# Patient Record
Sex: Female | Born: 1945
Health system: Southern US, Community
[De-identification: ages and names within clinical notes are randomized; demographics above are authoritative.]

## PROBLEM LIST (undated history)

## (undated) DIAGNOSIS — N189 Chronic kidney disease, unspecified: Secondary | ICD-10-CM

## (undated) HISTORY — PX: HERNIA REPAIR: SHX51

## (undated) HISTORY — PX: BACK SURGERY: SHX140

## (undated) HISTORY — PX: ABDOMINAL HYSTERECTOMY: SHX81

## (undated) HISTORY — PX: KNEE SURGERY: SHX244

---

## 2009-04-14 DIAGNOSIS — Z8719 Personal history of other diseases of the digestive system: Secondary | ICD-10-CM | POA: Insufficient documentation

## 2009-04-15 DIAGNOSIS — R7989 Other specified abnormal findings of blood chemistry: Secondary | ICD-10-CM | POA: Insufficient documentation

## 2009-04-15 DIAGNOSIS — E7800 Pure hypercholesterolemia, unspecified: Secondary | ICD-10-CM | POA: Insufficient documentation

## 2010-08-02 DIAGNOSIS — M797 Fibromyalgia: Secondary | ICD-10-CM | POA: Insufficient documentation

## 2011-04-11 DIAGNOSIS — M19019 Primary osteoarthritis, unspecified shoulder: Secondary | ICD-10-CM | POA: Insufficient documentation

## 2011-06-25 DIAGNOSIS — M21611 Bunion of right foot: Secondary | ICD-10-CM | POA: Insufficient documentation

## 2011-08-10 DIAGNOSIS — S336XXA Sprain of sacroiliac joint, initial encounter: Secondary | ICD-10-CM | POA: Insufficient documentation

## 2012-02-27 DIAGNOSIS — L219 Seborrheic dermatitis, unspecified: Secondary | ICD-10-CM | POA: Insufficient documentation

## 2012-06-30 DIAGNOSIS — K219 Gastro-esophageal reflux disease without esophagitis: Secondary | ICD-10-CM | POA: Insufficient documentation

## 2012-09-29 DIAGNOSIS — G4700 Insomnia, unspecified: Secondary | ICD-10-CM | POA: Insufficient documentation

## 2012-09-29 DIAGNOSIS — R0982 Postnasal drip: Secondary | ICD-10-CM | POA: Insufficient documentation

## 2012-09-30 DIAGNOSIS — E669 Obesity, unspecified: Secondary | ICD-10-CM | POA: Insufficient documentation

## 2013-07-10 DIAGNOSIS — N182 Chronic kidney disease, stage 2 (mild): Secondary | ICD-10-CM | POA: Insufficient documentation

## 2013-07-10 DIAGNOSIS — E875 Hyperkalemia: Secondary | ICD-10-CM | POA: Insufficient documentation

## 2013-11-26 DIAGNOSIS — M62838 Other muscle spasm: Secondary | ICD-10-CM | POA: Insufficient documentation

## 2014-02-23 DIAGNOSIS — M25532 Pain in left wrist: Secondary | ICD-10-CM | POA: Insufficient documentation

## 2014-06-28 DIAGNOSIS — M549 Dorsalgia, unspecified: Secondary | ICD-10-CM | POA: Insufficient documentation

## 2014-08-17 DIAGNOSIS — K5732 Diverticulitis of large intestine without perforation or abscess without bleeding: Secondary | ICD-10-CM | POA: Insufficient documentation

## 2015-10-10 DIAGNOSIS — K432 Incisional hernia without obstruction or gangrene: Secondary | ICD-10-CM | POA: Insufficient documentation

## 2016-07-04 DIAGNOSIS — R350 Frequency of micturition: Secondary | ICD-10-CM | POA: Insufficient documentation

## 2016-07-10 DIAGNOSIS — K8020 Calculus of gallbladder without cholecystitis without obstruction: Secondary | ICD-10-CM | POA: Insufficient documentation

## 2016-07-10 DIAGNOSIS — R110 Nausea: Secondary | ICD-10-CM | POA: Insufficient documentation

## 2016-11-08 DIAGNOSIS — F418 Other specified anxiety disorders: Secondary | ICD-10-CM | POA: Insufficient documentation

## 2017-04-24 DIAGNOSIS — R5381 Other malaise: Secondary | ICD-10-CM | POA: Insufficient documentation

## 2017-04-24 DIAGNOSIS — R5383 Other fatigue: Secondary | ICD-10-CM | POA: Insufficient documentation

## 2017-04-24 DIAGNOSIS — J101 Influenza due to other identified influenza virus with other respiratory manifestations: Secondary | ICD-10-CM | POA: Insufficient documentation

## 2017-06-12 DIAGNOSIS — R29898 Other symptoms and signs involving the musculoskeletal system: Secondary | ICD-10-CM | POA: Insufficient documentation

## 2017-11-25 DIAGNOSIS — N951 Menopausal and female climacteric states: Secondary | ICD-10-CM | POA: Insufficient documentation

## 2018-02-05 DIAGNOSIS — Z9049 Acquired absence of other specified parts of digestive tract: Secondary | ICD-10-CM | POA: Insufficient documentation

## 2018-03-10 DIAGNOSIS — J301 Allergic rhinitis due to pollen: Secondary | ICD-10-CM | POA: Insufficient documentation

## 2018-03-10 DIAGNOSIS — M25551 Pain in right hip: Secondary | ICD-10-CM | POA: Insufficient documentation

## 2018-03-10 DIAGNOSIS — M7150 Other bursitis, not elsewhere classified, unspecified site: Secondary | ICD-10-CM | POA: Insufficient documentation

## 2018-03-10 DIAGNOSIS — R0989 Other specified symptoms and signs involving the circulatory and respiratory systems: Secondary | ICD-10-CM | POA: Insufficient documentation

## 2018-03-10 DIAGNOSIS — M48061 Spinal stenosis, lumbar region without neurogenic claudication: Secondary | ICD-10-CM | POA: Insufficient documentation

## 2018-03-10 DIAGNOSIS — M1710 Unilateral primary osteoarthritis, unspecified knee: Secondary | ICD-10-CM | POA: Insufficient documentation

## 2018-03-10 DIAGNOSIS — R269 Unspecified abnormalities of gait and mobility: Secondary | ICD-10-CM | POA: Insufficient documentation

## 2018-03-10 DIAGNOSIS — R0609 Other forms of dyspnea: Secondary | ICD-10-CM | POA: Insufficient documentation

## 2018-03-10 DIAGNOSIS — N951 Menopausal and female climacteric states: Secondary | ICD-10-CM | POA: Insufficient documentation

## 2018-03-10 DIAGNOSIS — H3400 Transient retinal artery occlusion, unspecified eye: Secondary | ICD-10-CM | POA: Insufficient documentation

## 2018-03-10 DIAGNOSIS — Z833 Family history of diabetes mellitus: Secondary | ICD-10-CM | POA: Insufficient documentation

## 2018-03-10 DIAGNOSIS — M779 Enthesopathy, unspecified: Secondary | ICD-10-CM | POA: Insufficient documentation

## 2018-05-05 DIAGNOSIS — F341 Dysthymic disorder: Secondary | ICD-10-CM | POA: Insufficient documentation

## 2018-05-05 DIAGNOSIS — G500 Trigeminal neuralgia: Secondary | ICD-10-CM | POA: Insufficient documentation

## 2018-11-17 DIAGNOSIS — R7303 Prediabetes: Secondary | ICD-10-CM | POA: Insufficient documentation

## 2018-11-17 DIAGNOSIS — D72821 Monocytosis (symptomatic): Secondary | ICD-10-CM | POA: Insufficient documentation

## 2019-09-14 DIAGNOSIS — H43811 Vitreous degeneration, right eye: Secondary | ICD-10-CM | POA: Insufficient documentation

## 2019-12-14 DIAGNOSIS — G5602 Carpal tunnel syndrome, left upper limb: Secondary | ICD-10-CM | POA: Insufficient documentation

## 2019-12-14 DIAGNOSIS — N1831 Chronic kidney disease, stage 3a: Secondary | ICD-10-CM | POA: Insufficient documentation

## 2020-01-07 DIAGNOSIS — M25512 Pain in left shoulder: Secondary | ICD-10-CM | POA: Insufficient documentation

## 2020-07-01 DIAGNOSIS — M5432 Sciatica, left side: Secondary | ICD-10-CM | POA: Insufficient documentation

## 2020-07-01 DIAGNOSIS — N644 Mastodynia: Secondary | ICD-10-CM | POA: Insufficient documentation

## 2020-10-05 DIAGNOSIS — R030 Elevated blood-pressure reading, without diagnosis of hypertension: Secondary | ICD-10-CM | POA: Insufficient documentation

## 2021-01-05 DIAGNOSIS — M7061 Trochanteric bursitis, right hip: Secondary | ICD-10-CM | POA: Insufficient documentation

## 2021-01-05 DIAGNOSIS — M7062 Trochanteric bursitis, left hip: Secondary | ICD-10-CM | POA: Insufficient documentation

## 2021-02-06 DIAGNOSIS — N764 Abscess of vulva: Secondary | ICD-10-CM | POA: Insufficient documentation

## 2021-04-03 ENCOUNTER — Encounter: Payer: Self-pay | Admitting: Emergency Medicine

## 2021-04-03 ENCOUNTER — Ambulatory Visit
Admission: EM | Admit: 2021-04-03 | Discharge: 2021-04-03 | Disposition: A | Discharge status: Home / Self Care | Payer: Medicare Other

## 2021-04-03 ENCOUNTER — Other Ambulatory Visit: Payer: Self-pay

## 2021-04-03 ENCOUNTER — Ambulatory Visit: Admission: EM | Admit: 2021-04-03 | Discharge status: Absent without leave | Payer: Self-pay | Source: Home / Self Care

## 2021-04-03 DIAGNOSIS — M5442 Lumbago with sciatica, left side: Secondary | ICD-10-CM

## 2021-04-03 HISTORY — DX: Chronic kidney disease, unspecified: N18.9

## 2021-04-03 NOTE — ED Triage Notes (Signed)
C/o left leg pain worsening over the last week, going from left buttock into left knee/lower leg. States occasional numbness/tingling. Reports hx of sciatic nerve irritation. Denies acute injury to area. Able to walk with mild gait abnormalities/limp

## 2021-04-03 NOTE — ED Provider Notes (Addendum)
[facility]    CSN: [id number] Arrival date & time: 04/03/21  1501      History   Chief Complaint Chief Complaint  Patient presents with   Knee Pain    HPI Angelica James is a 75 y.o. female.   Patient presents with left buttocks pain that radiates throughout entire left leg that has been worsening over the past week.  Has had some occasional numbness but denies tingling.  Patient reports that this has been ongoing for over a month but has worsened over the past week.  Patient was seen on 02/27/2021 and received a spinal injection by the pain management doctor.  Patient reports no improvement with this.  Was then seen on 03/11/2021 for a "lump behind the left knee".  An ultrasound was completed that did not show DVT.  Patient was then seen on 03/21/2021 by family medicine and received prednisone taper as well as ketorolac injection that day.  Patient reports no improvement with these medications.  Patient was then seen on 03/29/2021 by pain management doctor again where no treatment was performed at that time but lumbar decompression or neurosurgical consult were discussed.  Per note, patient wished to have lumbar decompression completed.  This has not been scheduled or completed just yet.  Patient reports that pain is exacerbated by movement.  She reports that pain is severe and is described as a "sharp pain".  Patient does have history of lumbar stenosis as found by MRI multiple years ago per patient.  Denies any urinary or bowel incontinence, saddle anesthesia, urinary burning or frequency.  Patient has taken Tylenol, gabapentin, naproxen for pain as well with no improvement.   Knee Pain  Past Medical History:  Diagnosis Date   Chronic kidney disease     Patient Active Problem List   Diagnosis Date Noted   Abscess of left genital labia 02/06/2021   Trochanteric bursitis of both hips 01/05/2021   Elevated BP without diagnosis of hypertension 10/05/2020   Breast pain,  left 07/01/2020   Sciatica, left side 07/01/2020   Acute pain of left shoulder 01/07/2020   Carpal tunnel syndrome on left 12/14/2019   Stage 3a chronic kidney disease ([place]) 12/14/2019   Posterior vitreous detachment of right eye 09/14/2019   Monocytosis 11/17/2018   Prediabetes 11/17/2018   Persistent depressive disorder with anxious distress, currently mild 05/05/2018   Trigeminal neuralgia of right side of face 05/05/2018   Abnormality of gait 03/10/2018   Enthesopathy 03/10/2018   Family history of diabetes mellitus 03/10/2018   Hip pain, right 03/10/2018   Non-seasonal allergic rhinitis due to pollen 03/10/2018   Other bursitis disorders 03/10/2018   Other dyspnea and respiratory abnormality 03/10/2018   Primary localized osteoarthrosis, lower leg 03/10/2018   Spinal stenosis of lumbar region without neurogenic claudication 03/10/2018   Symptomatic menopausal or female climacteric states 03/10/2018   Transient arterial retinal occlusion 03/10/2018   S/P chole 02/05/2018   Vasomotor symptoms due to menopause 11/25/2017   Left leg weakness 06/12/2017   Influenza B 04/24/2017   Malaise and fatigue 04/24/2017   Other specified anxiety disorders 11/08/2016   Calculus of gallbladder without cholecystitis without obstruction 07/10/2016   Nausea in adult 07/10/2016   Increased frequency of urination 07/04/2016   Incisional hernia, without obstruction or gangrene 10/10/2015   Diverticulitis of sigmoid colon 08/17/2014   Dorsalgia, unspecified 06/28/2014   Wrist pain, left 02/23/2014   Muscle spasms of neck 11/26/2013   Chronic kidney disease, stage II (mild)  07/10/2013   Hyperkalemia 07/10/2013   Obesity (BMI 30-39.9) 09/30/2012   Insomnia 09/29/2012   Post-nasal drip 09/29/2012   Gastro-esophageal reflux disease without esophagitis 06/30/2012   Dermatitis, seborrheic 02/27/2012   Sprain of unspecified site of sacroiliac region 08/10/2011   Bilateral bunions 06/25/2011   DJD  of shoulder 04/11/2011   Fibromyalgia 08/02/2010   Hypercholesteremia 04/15/2009   Positive D-dimer 04/15/2009   History of gastroesophageal reflux (GERD) 04/14/2009    Past Surgical History:  Procedure Laterality Date   ABDOMINAL HYSTERECTOMY     BACK SURGERY     HERNIA REPAIR     KNEE SURGERY      OB History   No obstetric history on file.      Home Medications    Prior to Admission medications   Medication Sig Start Date End Date Taking? Authorizing Provider  acetaminophen (TYLENOL) 650 MG CR tablet Take 1 tablet by mouth daily. 04/18/09  Yes [provider]  albuterol (VENTOLIN HFA) 108 (90 Base) MCG/ACT inhaler Inhale into the lungs. 01/05/21  Yes [provider]  cetirizine (ZYRTEC) 10 MG tablet Take by mouth. 10/05/20  Yes [provider]  cyclobenzaprine (FLEXERIL) 10 MG tablet Take by mouth. 11/08/16  Yes [provider]  Docusate Sodium (DSS) 100 MG CAPS Take by mouth. 11/24/15  Yes [provider]  fluticasone (FLONASE) 50 MCG/ACT nasal spray Place into the nose. 07/10/13  Yes [provider]  gabapentin (NEURONTIN) 100 MG capsule Take by mouth. 07/01/20  Yes [provider]  linaclotide Rolan Lipa) 290 MCG CAPS capsule Take by mouth. 02/13/18  Yes [provider]  omeprazole (PRILOSEC) 40 MG capsule Take by mouth. 12/10/20  Yes [provider]  aspirin 81 MG EC tablet Take by mouth.    [provider]  Cholecalciferol 25 MCG (1000 UT) tablet Take by mouth.    [provider]    Family History History reviewed. No pertinent family history.  Social History Social History   Tobacco Use   Smoking status: Former    Types: Cigarettes   Smokeless tobacco: Never     Allergies   Penicillins and Meloxicam   Review of Systems Review of Systems Per HPI  Physical Exam Triage Vital Signs ED Triage Vitals  Enc Vitals Group     BP 04/03/21 1619 (!) 163/74     Pulse Rate  04/03/21 1619 (!) 103     Resp 04/03/21 1619 16     Temp 04/03/21 1619 98.2 F (36.8 C)     Temp Source 04/03/21 1619 Oral     SpO2 04/03/21 1619 97 %     Weight --      Height --      Head Circumference --      Peak Flow --      Pain Score 04/03/21 1620 10     Pain Loc --      Pain Edu? --      Excl. in [place]? --    No data found.  Updated Vital Signs BP (!) 163/74 (BP Location: Left Arm)   Pulse (!) 103   Temp 98.2 F (36.8 C) (Oral)   Resp 16   SpO2 97%   Visual Acuity Right Eye Distance:   Left Eye Distance:   Bilateral Distance:    Right Eye Near:   Left Eye Near:    Bilateral Near:     Physical Exam Constitutional:      General: She is not in  acute distress.    Appearance: Normal appearance. She is not toxic-appearing or diaphoretic.  HENT:     Head: Normocephalic and atraumatic.  Eyes:     Extraocular Movements: Extraocular movements intact.     Conjunctiva/sclera: Conjunctivae normal.  Pulmonary:     Effort: Pulmonary effort is normal.  Musculoskeletal:     Cervical back: Normal.     Thoracic back: Normal.     Lumbar back: Tenderness present. No swelling, edema or bony tenderness. Normal range of motion. Negative right straight leg raise test and negative left straight leg raise test.       Back:     Comments: Tenderness to palpation to left lower lumbar region.  Unable to perform straight leg raise as patient is not able to tolerate laying flat.  Neurovascularly intact throughout left lower leg.  No tenderness to palpation throughout left lower leg.  Neurological:     General: No focal deficit present.     Mental Status: She is alert and oriented to person, place, and time. Mental status is at baseline.  Psychiatric:        Mood and Affect: Mood normal.        Behavior: Behavior normal.        Thought Content: Thought content normal.        Judgment: Judgment normal.     UC Treatments / Results  Labs (all labs ordered are listed, but only  abnormal results are displayed) Labs Reviewed - No data to display  EKG   Radiology No results found.  Procedures Procedures (including critical care time)  Medications Ordered in UC Medications - No data to display  Initial Impression / Assessment and Plan / UC Course  I have reviewed the triage vital signs and the nursing notes.  Pertinent labs & imaging results that were available during my care of the patient were reviewed by me and considered in my medical decision making (see chart for details).     There are limited options for pain management at this urgent care today.  Unable to prescribe additional steroid treatment as patient has had steroid injection as well as prednisone steroid taper over the past month. Unable to prescribe additional NSAIDs due to recent ketorolac injection as well as naproxen treatment due to not causing any additional kidney damage as patient has CKD stage II.  Also unable to prescribe controlled substances given patient's age and CKD.  Will defer pain management to orthopedist.  Advised patient that she will need to see Orthopedic or spine specialist for further evaluation and management given that pain is refractory to current methods.Patient was provided with contact information for orthopedic urgent cares as well as spine specialist.  Patient was advised to follow-up with these as soon as possible.Discussed strict return precautions. Patient verbalized understanding and is agreeable with plan.  No red flags seen on exam that would warrant immediate evaluation at the hospital at this time. Final Clinical Impressions(s) / UC Diagnoses   Final diagnoses:  Acute left-sided low back pain with left-sided sciatica     Discharge Instructions      Please follow-up with provided contact information for orthopedist as soon as possible.     ED Prescriptions   None    PDMP not reviewed this encounter.   Odis Luster, FNP 04/03/21 [place], [place], [place] 04/03/21 1704

## 2021-04-03 NOTE — Discharge Instructions (Addendum)
Please follow-up with provided contact information for orthopedist as soon as possible.

## 2021-07-21 ENCOUNTER — Telehealth: Payer: Self-pay | Admitting: Hematology

## 2021-07-21 NOTE — Telephone Encounter (Signed)
Scheduled appt per 2/2 referral. Spoke to pt who is aware of appt date and time. Pt is aware to arrive 15 mins prior to appt time. Per pt request updated calendar was sent to pt.

## 2021-08-10 ENCOUNTER — Other Ambulatory Visit: Payer: Self-pay

## 2021-08-10 ENCOUNTER — Inpatient Hospital Stay: Payer: Medicare Other

## 2021-08-10 ENCOUNTER — Inpatient Hospital Stay: Payer: Medicare Other | Attending: Hematology | Admitting: Hematology

## 2021-08-10 VITALS — BP 122/58 | HR 111 | Temp 97.3°F | Resp 18 | Wt 200.6 lb

## 2021-08-10 DIAGNOSIS — Z818 Family history of other mental and behavioral disorders: Secondary | ICD-10-CM | POA: Diagnosis not present

## 2021-08-10 DIAGNOSIS — M549 Dorsalgia, unspecified: Secondary | ICD-10-CM

## 2021-08-10 DIAGNOSIS — M47819 Spondylosis without myelopathy or radiculopathy, site unspecified: Secondary | ICD-10-CM | POA: Diagnosis not present

## 2021-08-10 DIAGNOSIS — Z811 Family history of alcohol abuse and dependence: Secondary | ICD-10-CM | POA: Diagnosis not present

## 2021-08-10 DIAGNOSIS — M48061 Spinal stenosis, lumbar region without neurogenic claudication: Secondary | ICD-10-CM | POA: Diagnosis not present

## 2021-08-10 DIAGNOSIS — Z809 Family history of malignant neoplasm, unspecified: Secondary | ICD-10-CM

## 2021-08-10 DIAGNOSIS — Z880 Allergy status to penicillin: Secondary | ICD-10-CM

## 2021-08-10 DIAGNOSIS — Z79899 Other long term (current) drug therapy: Secondary | ICD-10-CM | POA: Diagnosis not present

## 2021-08-10 DIAGNOSIS — Z87891 Personal history of nicotine dependence: Secondary | ICD-10-CM | POA: Diagnosis not present

## 2021-08-10 DIAGNOSIS — Z803 Family history of malignant neoplasm of breast: Secondary | ICD-10-CM

## 2021-08-10 DIAGNOSIS — Z888 Allergy status to other drugs, medicaments and biological substances status: Secondary | ICD-10-CM

## 2021-08-10 DIAGNOSIS — R898 Other abnormal findings in specimens from other organs, systems and tissues: Secondary | ICD-10-CM | POA: Diagnosis not present

## 2021-08-10 DIAGNOSIS — Z800 Family history of malignant neoplasm of digestive organs: Secondary | ICD-10-CM | POA: Diagnosis not present

## 2021-08-10 DIAGNOSIS — D472 Monoclonal gammopathy: Secondary | ICD-10-CM

## 2021-08-10 LAB — CMP (CANCER CENTER ONLY)
ALT: 16 U/L (ref 0–44)
AST: 17 U/L (ref 15–41)
Albumin: 4.9 g/dL (ref 3.5–5.0)
Alkaline Phosphatase: 66 U/L (ref 38–126)
Anion gap: 7 (ref 5–15)
BUN: 18 mg/dL (ref 8–23)
CO2: 32 mmol/L (ref 22–32)
Calcium: 10.2 mg/dL (ref 8.9–10.3)
Chloride: 102 mmol/L (ref 98–111)
Creatinine: 0.99 mg/dL (ref 0.44–1.00)
GFR, Estimated: 59 mL/min — ABNORMAL LOW (ref >60)
Glucose, Bld: 85 mg/dL (ref 70–99)
Potassium: 3.3 mmol/L — ABNORMAL LOW (ref 3.5–5.1)
Sodium: 141 mmol/L (ref 135–145)
Total Bilirubin: 0.9 mg/dL (ref 0.3–1.2)
Total Protein: 8.3 g/dL — ABNORMAL HIGH (ref 6.5–8.1)

## 2021-08-10 LAB — CBC WITH DIFFERENTIAL/PLATELET
Abs Immature Granulocytes: 0.26 10*3/uL — ABNORMAL HIGH (ref 0.00–0.07)
Basophils Absolute: 0.1 10*3/uL (ref 0.0–0.1)
Basophils Relative: 1 %
Eosinophils Absolute: 0.2 10*3/uL (ref 0.0–0.5)
Eosinophils Relative: 1 %
HCT: 42.9 % (ref 36.0–46.0)
Hemoglobin: 14.1 g/dL (ref 12.0–15.0)
Immature Granulocytes: 1 %
Lymphocytes Relative: 14 %
Lymphs Abs: 2.5 10*3/uL (ref 0.7–4.0)
MCH: 29.7 pg (ref 26.0–34.0)
MCHC: 32.9 g/dL (ref 30.0–36.0)
MCV: 90.5 fL (ref 80.0–100.0)
Monocytes Absolute: 2.3 10*3/uL — ABNORMAL HIGH (ref 0.1–1.0)
Monocytes Relative: 13 %
Neutro Abs: 13.2 10*3/uL — ABNORMAL HIGH (ref 1.7–7.7)
Neutrophils Relative %: 70 %
Platelets: 420 10*3/uL — ABNORMAL HIGH (ref 150–400)
RBC: 4.74 MIL/uL (ref 3.87–5.11)
RDW: 13.4 % (ref 11.5–15.5)
WBC: 18.6 10*3/uL — ABNORMAL HIGH (ref 4.0–10.5)
nRBC: 0 % (ref 0.0–0.2)

## 2021-08-10 LAB — LACTATE DEHYDROGENASE: LDH: 318 U/L — ABNORMAL HIGH (ref 98–192)
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
## 2021-08-11 LAB — SURGICAL PATHOLOGY

## 2021-08-11 LAB — BETA 2 MICROGLOBULIN, SERUM: Beta-2 Microglobulin: 2.1 mg/L (ref 0.6–2.4)

## 2021-08-11 LAB — KAPPA/LAMBDA LIGHT CHAINS
Kappa free light chain: 24.9 mg/L — ABNORMAL HIGH (ref 3.3–19.4)
Kappa, lambda light chain ratio: 1.69 — ABNORMAL HIGH (ref 0.26–1.65)
Lambda free light chains: 14.7 mg/L (ref 5.7–26.3)

## 2021-08-14 ENCOUNTER — Other Ambulatory Visit: Payer: Self-pay

## 2021-08-14 ENCOUNTER — Ambulatory Visit (HOSPITAL_BASED_OUTPATIENT_CLINIC_OR_DEPARTMENT_OTHER): Payer: Medicare Other | Attending: Sports Medicine | Admitting: Physical Therapy

## 2021-08-14 DIAGNOSIS — M47816 Spondylosis without myelopathy or radiculopathy, lumbar region: Secondary | ICD-10-CM | POA: Insufficient documentation

## 2021-08-14 DIAGNOSIS — M5416 Radiculopathy, lumbar region: Secondary | ICD-10-CM | POA: Insufficient documentation

## 2021-08-14 DIAGNOSIS — G8929 Other chronic pain: Secondary | ICD-10-CM | POA: Diagnosis not present

## 2021-08-14 DIAGNOSIS — M62830 Muscle spasm of back: Secondary | ICD-10-CM | POA: Diagnosis not present

## 2021-08-14 DIAGNOSIS — M545 Low back pain, unspecified: Secondary | ICD-10-CM | POA: Diagnosis not present

## 2021-08-14 DIAGNOSIS — M25552 Pain in left hip: Secondary | ICD-10-CM

## 2021-08-14 DIAGNOSIS — R2689 Other abnormalities of gait and mobility: Secondary | ICD-10-CM | POA: Insufficient documentation

## 2021-08-14 DIAGNOSIS — M5136 Other intervertebral disc degeneration, lumbar region: Secondary | ICD-10-CM | POA: Diagnosis not present

## 2021-08-14 LAB — MULTIPLE MYELOMA PANEL, SERUM
Albumin SerPl Elph-Mcnc: 4.4 g/dL (ref 2.9–4.4)
Albumin/Glob SerPl: 1.3 (ref 0.7–1.7)
Alpha 1: 0.2 g/dL (ref 0.0–0.4)
Alpha2 Glob SerPl Elph-Mcnc: 0.7 g/dL (ref 0.4–1.0)
B-Globulin SerPl Elph-Mcnc: 1.1 g/dL (ref 0.7–1.3)
Gamma Glob SerPl Elph-Mcnc: 1.6 g/dL (ref 0.4–1.8)
Globulin, Total: 3.6 g/dL (ref 2.2–3.9)
IgA: 251 mg/dL (ref 64–422)
IgG (Immunoglobin G), Serum: 1562 mg/dL (ref 586–1602)
IgM (Immunoglobulin M), Srm: 207 mg/dL (ref 26–217)
Total Protein ELP: 8 g/dL (ref 6.0–8.5)

## 2021-08-14 LAB — FLOW CYTOMETRY

## 2021-08-14 NOTE — Therapy (Signed)
OUTPATIENT PHYSICAL THERAPY THORACOLUMBAR EVALUATION   Patient Name: Angelica James MRN: [id number] DOB:Oct 26, 1945, 76 y.o., female Today's Date: 08/14/2021   PT End of Session - 08/14/21 1209     Visit Number 1    Number of Visits 12    Date for PT Re-Evaluation 09/25/21    PT Start Time 1147    PT Stop Time 0100    PT Time Calculation (min) 793 min    Activity Tolerance Patient tolerated treatment well    Behavior During Therapy [facility] for tasks assessed/performed             Past Medical History:  Diagnosis Date   Chronic kidney disease    Past Surgical History:  Procedure Laterality Date   ABDOMINAL HYSTERECTOMY     BACK SURGERY     HERNIA REPAIR     KNEE SURGERY     Patient Active Problem List   Diagnosis Date Noted   Abscess of left genital labia 02/06/2021   Trochanteric bursitis of both hips 01/05/2021   Elevated BP without diagnosis of hypertension 10/05/2020   Breast pain, left 07/01/2020   Sciatica, left side 07/01/2020   Acute pain of left shoulder 01/07/2020   Carpal tunnel syndrome on left 12/14/2019   Stage 3a chronic kidney disease ([place]) 12/14/2019   Posterior vitreous detachment of right eye 09/14/2019   Monocytosis 11/17/2018   Prediabetes 11/17/2018   Persistent depressive disorder with anxious distress, currently mild 05/05/2018   Trigeminal neuralgia of right side of face 05/05/2018   Abnormality of gait 03/10/2018   Enthesopathy 03/10/2018   Family history of diabetes mellitus 03/10/2018   Hip pain, right 03/10/2018   Non-seasonal allergic rhinitis due to pollen 03/10/2018   Other bursitis disorders 03/10/2018   Other dyspnea and respiratory abnormality 03/10/2018   Primary localized osteoarthrosis, lower leg 03/10/2018   Spinal stenosis of lumbar region without neurogenic claudication 03/10/2018   Symptomatic menopausal or female climacteric states 03/10/2018   Transient arterial retinal occlusion 03/10/2018   S/P chole 02/05/2018    Vasomotor symptoms due to menopause 11/25/2017   Left leg weakness 06/12/2017   Influenza B 04/24/2017   Malaise and fatigue 04/24/2017   Other specified anxiety disorders 11/08/2016   Calculus of gallbladder without cholecystitis without obstruction 07/10/2016   Nausea in adult 07/10/2016   Increased frequency of urination 07/04/2016   Incisional hernia, without obstruction or gangrene 10/10/2015   Diverticulitis of sigmoid colon 08/17/2014   Dorsalgia, unspecified 06/28/2014   Wrist pain, left 02/23/2014   Muscle spasms of neck 11/26/2013   Chronic kidney disease, stage II (mild) 07/10/2013   Hyperkalemia 07/10/2013   Obesity (BMI 30-39.9) 09/30/2012   Insomnia 09/29/2012   Post-nasal drip 09/29/2012   Gastro-esophageal reflux disease without esophagitis 06/30/2012   Dermatitis, seborrheic 02/27/2012   Sprain of unspecified site of sacroiliac region 08/10/2011   Bilateral bunions 06/25/2011   DJD of shoulder 04/11/2011   Fibromyalgia 08/02/2010   Hypercholesteremia 04/15/2009   Positive D-dimer 04/15/2009   History of gastroesophageal reflux (GERD) 04/14/2009    PCP: Pcp, No  REFERRING PROVIDER: Verner Chol, MD  REFERRING DIAG: Low Back Pain/ Non-invasive decompression of the spine in October of 2022.   THERAPY DIAG: Low Back Pain Left Hip Pain    ONSET DATE: Became Severe in December   SUBJECTIVE:  SUBJECTIVE STATEMENT: The patient reports she began having pain in December of this year in her left hip. It is unclear if she had pain before this. She reports she had some type of decompression in October. Again the timeline of the onset is not very clear. She had been diagnosed with multiple myloma of the back. She reports she is not from this area. She was visiting and got sick.    PERTINENT HISTORY:  Chronic kidney disease  Anxiety ;  Left leg weakness, Trigeminal Neuralgia; Left shoulder DDD; cn have pain on the right as well; fibromylagia  PAIN:  Are you having pain? Yes NPRS scale: 7/10 right now over the past week 9/10 at worst  Pain location: Across the lower back and into the hip. Can go into her anterior hip as well at times  Pain orientation: Left  PAIN TYPE: aching Pain description: constant  Aggravating factors: Patient has increased pain when she is standing for too long  Relieving factors: sitting down   PRECAUTIONS: None  WEIGHT BEARING RESTRICTIONS No  FALLS:  Has patient fallen in last 6 months? No, Number of falls:   LIVING ENVIRONMENT: The patient has an upstairs to go into to get into and out of pain   OCCUPATION: retired   PLOF: Independent  Recreation: walking   PATIENT GOALS   Help the pain/ Keep from having surgery     OBJECTIVE:   DIAGNOSTIC FINDINGS:  Lumbar MRI: L5-S1 moderate to severe left lateral reccess stenois with S1 nerve impingement  Diffusely abnormal arrow signal throughout the visible spine and pelvis  PATIENT SURVEYS:  FOTO not given 2nd to cognition   SCREENING FOR RED FLAGS: Bowel or bladder incontinence: No Spinal tumors: No Cauda equina syndrome: No Compression fracture: No Abdominal aneurysm: No  COGNITION:  Overall cognitive status: Difficulty to assess Could not recall where she lives but also reports she isnt from this area. Had to ask questions a few times. Speaks very quietly.      SENSATION:  Light touch: Appears intact  Stereognosis: Appears intact  Hot/Cold: Appears intact  Proprioception: Appears intact Denies paraesthesias  MUSCLE LENGTH:  POSTURE:    PALPATION: Significant spasming and tenderness to palpation in the left hip and lower back  LUMBARAROM/PROM  A/PROM A/PROM  08/14/2021  Flexion 70 with minor pain   Extension Increased pain just past neutral   Right  lateral flexion Painful but difficult to tell if one way was more painful then the other  Left lateral flexion Painful but difficult to tell if one way was more painful then the other   Right rotation   Left rotation    (Blank rows = not tested)  LE AROM/PROM:  PROM Right 08/14/2021 Left 08/14/2021  Hip flexion 80 degrees before she felt pain  90 with significant pain. Therapy asked patient to make sure to let the therapist know when she was in pain.   Hip extension    Hip abduction    Hip adduction    Hip internal rotation    Hip external rotation    Knee flexion    Knee extension    Ankle dorsiflexion    Ankle plantarflexion    Ankle inversion    Ankle eversion     (Blank rows = not tested)  LE MMT:  MMT Right 08/14/2021 Left 08/14/2021  Hip flexion 3/5 3/5  Hip extension    Hip abduction 4/5 3+/5  Hip adduction    Hip internal rotation  Hip external rotation    Knee flexion    Knee extension 4+/5 4+/5   Ankle dorsiflexion    Ankle plantarflexion    Ankle inversion    Ankle eversion     GAIT: Very slow steps, lateral movement away from the left side; decreased single leg stance on the left   TODAY'S TREATMENT     PATIENT EDUCATION:  Education details: HEP; symptom management  Person educated: Patient Education method: Explanation, Demonstration, Tactile cues, Verbal cues, and Handouts Education comprehension: needs further education   HOME EXERCISE PROGRAM:   ASSESSMENT:  CLINICAL IMPRESSION: Patient is a 76 year old female with left sided lower back pain and left hip pain. She was a bit unclear with her timeline of onset. She also has multiple myleoma. She has significant pain with lumbar extension and hip flexion bilateral. She has significant spasming in her left lower back and into her left hip. She would benefit most from aquatic therapy at this time. The family lives a longer distance from the clinic. She is unsure if she will be able to get out  here for aquatics. If she can not we will likely transfer her to [address] for treatment. She lives very close to [employer]. Again, the pool would be the most beneficial. She was not given an HEP today. Her motion and strength are significantly limited. We will have to take our time to find exercises that she can tolerate at this time. The patients daughter will call back to discuss her mothers POC.      OBJECTIVE IMPAIRMENTS Abnormal gait, decreased activity tolerance, decreased endurance, decreased mobility, difficulty walking, decreased ROM, decreased strength, increased fascial restrictions, increased muscle spasms, and pain.   ACTIVITY LIMITATIONS cleaning, meal prep, and shopping.   PERSONAL FACTORS Chronic kidney disease ;Anxiety ;  Left leg weakness, Trigeminal Neuralgia; Left shoulder DDD; cn have pain on the right as well; fibromylagia  are also affecting patient's functional outcome.    REHAB POTENTIAL: Good  CLINICAL DECISION MAKING: Evolving/moderate complexity progressive decrease in function 2nd to pain   EVALUATION COMPLEXITY: Moderate   GOALS: Goals reviewed with patient? Yes  SHORT TERM GOALS:  STG Name Target Date Goal status  1 Patient will increase passive bilateral hip flexion to 90 degrees  Baseline:  09/04/2021 INITIAL  2 Patient will increase gross bilateral LE strength by 1 grade  Baseline:  09/04/2021 INITIAL  3 Patient will be independent with a basic HEP  Baseline: 09/04/2021 INITIAL  LONG TERM GOALS:   LTG Name Target Date Goal status  1 Patient will stand for 15 minutes without pain  Baseline: 09/25/2021 INITIAL  2 Patient will walk 3000' without pain in order to walk without difficulty  Baseline: 09/25/2021 INITIAL  3 Patient will demonstrate full lumbar ROM bilateral in order to perform ADL's  Baseline: 09/25/2021 INITIAL  PLAN: PT FREQUENCY: 2x/week  PT DURATION: 6 weeks  PLANNED INTERVENTIONS: Therapeutic exercises, Therapeutic activity,  Neuro Muscular re-education, Gait training, Patient/Family education, Joint mobilization, Dry Needling, Electrical stimulation, Cryotherapy, Moist heat, Ultrasound, and Manual therapy  PLAN FOR NEXT SESSION:  If she does the pool consider the float position and manual therapy to the back. If patient is not in the pool consider grade 1 or II LAD to improve left hip flexion; trigger point release to the left hip; gluteal strengthening as tolerated. Wokr on more normal gait in the pool. Per MRI patient may have S1 nerve root compression so follow symptoms when advancing  or scaling back her exercises. We will contact the patient daughter about transferring or starting in the pool.    Carney Living, PT 08/14/2021, 1:40 PM

## 2021-08-25 ENCOUNTER — Inpatient Hospital Stay: Payer: Medicare Other | Attending: Hematology | Admitting: Hematology

## 2021-08-25 DIAGNOSIS — D72821 Monocytosis (symptomatic): Secondary | ICD-10-CM | POA: Diagnosis not present

## 2021-08-25 DIAGNOSIS — Z87891 Personal history of nicotine dependence: Secondary | ICD-10-CM | POA: Insufficient documentation

## 2021-08-25 DIAGNOSIS — R7402 Elevation of levels of lactic acid dehydrogenase (LDH): Secondary | ICD-10-CM | POA: Diagnosis not present

## 2021-08-25 DIAGNOSIS — Z818 Family history of other mental and behavioral disorders: Secondary | ICD-10-CM | POA: Diagnosis not present

## 2021-08-25 DIAGNOSIS — Z79899 Other long term (current) drug therapy: Secondary | ICD-10-CM | POA: Diagnosis not present

## 2021-08-25 DIAGNOSIS — Z880 Allergy status to penicillin: Secondary | ICD-10-CM | POA: Insufficient documentation

## 2021-08-25 DIAGNOSIS — M48061 Spinal stenosis, lumbar region without neurogenic claudication: Secondary | ICD-10-CM | POA: Diagnosis not present

## 2021-08-25 DIAGNOSIS — M47819 Spondylosis without myelopathy or radiculopathy, site unspecified: Secondary | ICD-10-CM | POA: Diagnosis not present

## 2021-08-25 DIAGNOSIS — R898 Other abnormal findings in specimens from other organs, systems and tissues: Secondary | ICD-10-CM | POA: Diagnosis not present

## 2021-08-25 DIAGNOSIS — Z800 Family history of malignant neoplasm of digestive organs: Secondary | ICD-10-CM | POA: Insufficient documentation

## 2021-08-25 DIAGNOSIS — N1831 Chronic kidney disease, stage 3a: Secondary | ICD-10-CM | POA: Insufficient documentation

## 2021-08-25 DIAGNOSIS — D75839 Thrombocytosis, unspecified: Secondary | ICD-10-CM | POA: Insufficient documentation

## 2021-08-25 DIAGNOSIS — Z888 Allergy status to other drugs, medicaments and biological substances status: Secondary | ICD-10-CM | POA: Diagnosis not present

## 2021-08-25 DIAGNOSIS — M549 Dorsalgia, unspecified: Secondary | ICD-10-CM | POA: Diagnosis not present

## 2021-08-25 DIAGNOSIS — D472 Monoclonal gammopathy: Secondary | ICD-10-CM | POA: Insufficient documentation

## 2021-08-30 ENCOUNTER — Ambulatory Visit (HOSPITAL_BASED_OUTPATIENT_CLINIC_OR_DEPARTMENT_OTHER): Payer: Medicare Other | Attending: Sports Medicine | Admitting: Physical Therapy

## 2021-08-30 ENCOUNTER — Encounter (HOSPITAL_BASED_OUTPATIENT_CLINIC_OR_DEPARTMENT_OTHER): Payer: Self-pay | Admitting: Physical Therapy

## 2021-08-30 ENCOUNTER — Other Ambulatory Visit: Payer: Self-pay

## 2021-08-30 DIAGNOSIS — M545 Low back pain, unspecified: Secondary | ICD-10-CM | POA: Diagnosis present

## 2021-08-30 DIAGNOSIS — R2689 Other abnormalities of gait and mobility: Secondary | ICD-10-CM | POA: Insufficient documentation

## 2021-08-30 DIAGNOSIS — G8929 Other chronic pain: Secondary | ICD-10-CM | POA: Diagnosis present

## 2021-08-30 DIAGNOSIS — M62830 Muscle spasm of back: Secondary | ICD-10-CM | POA: Insufficient documentation

## 2021-08-30 DIAGNOSIS — M25552 Pain in left hip: Secondary | ICD-10-CM | POA: Diagnosis present

## 2021-08-30 NOTE — Therapy (Signed)
?OUTPATIENT PHYSICAL THERAPY TREATMENT NOTE ? ? ?Patient Name: Angelica James ?MRN: [id number] ?DOB:05/03/1946, 76 y.o., female ?Today's Date: 08/30/2021 ? ?PCP: Pcp, No ?REFERRING PROVIDER: Verner Chol, MD ? ? PT End of Session - 08/30/21 0932   ? ? Visit Number 2   ? Number of Visits 12   ? Date for PT Re-Evaluation 09/25/21   ? PT Start Time 0815   ? PT Stop Time 0900   ? PT Time Calculation (min) 45 min   ? ?  ?  ? ?  ? ? ?Past Medical History:  ?Diagnosis Date  ? Chronic kidney disease   ? ?Past Surgical History:  ?Procedure Laterality Date  ? ABDOMINAL HYSTERECTOMY    ? BACK SURGERY    ? HERNIA REPAIR    ? KNEE SURGERY    ? ?Patient Active Problem List  ? Diagnosis Date Noted  ? Abscess of left genital labia 02/06/2021  ? Trochanteric bursitis of both hips 01/05/2021  ? Elevated BP without diagnosis of hypertension 10/05/2020  ? Breast pain, left 07/01/2020  ? Sciatica, left side 07/01/2020  ? Acute pain of left shoulder 01/07/2020  ? Carpal tunnel syndrome on left 12/14/2019  ? Stage 3a chronic kidney disease ([place]) 12/14/2019  ? Posterior vitreous detachment of right eye 09/14/2019  ? Monocytosis 11/17/2018  ? Prediabetes 11/17/2018  ? Persistent depressive disorder with anxious distress, currently mild 05/05/2018  ? Trigeminal neuralgia of right side of face 05/05/2018  ? Abnormality of gait 03/10/2018  ? Enthesopathy 03/10/2018  ? Family history of diabetes mellitus 03/10/2018  ? Hip pain, right 03/10/2018  ? Non-seasonal allergic rhinitis due to pollen 03/10/2018  ? Other bursitis disorders 03/10/2018  ? Other dyspnea and respiratory abnormality 03/10/2018  ? Primary localized osteoarthrosis, lower leg 03/10/2018  ? Spinal stenosis of lumbar region without neurogenic claudication 03/10/2018  ? Symptomatic menopausal or female climacteric states 03/10/2018  ? Transient arterial retinal occlusion 03/10/2018  ? S/P chole 02/05/2018  ? Vasomotor symptoms due to menopause 11/25/2017  ? Left leg weakness  06/12/2017  ? Influenza B 04/24/2017  ? Malaise and fatigue 04/24/2017  ? Other specified anxiety disorders 11/08/2016  ? Calculus of gallbladder without cholecystitis without obstruction 07/10/2016  ? Nausea in adult 07/10/2016  ? Increased frequency of urination 07/04/2016  ? Incisional hernia, without obstruction or gangrene 10/10/2015  ? Diverticulitis of sigmoid colon 08/17/2014  ? Dorsalgia, unspecified 06/28/2014  ? Wrist pain, left 02/23/2014  ? Muscle spasms of neck 11/26/2013  ? Chronic kidney disease, stage II (mild) 07/10/2013  ? Hyperkalemia 07/10/2013  ? Obesity (BMI 30-39.9) 09/30/2012  ? Insomnia 09/29/2012  ? Post-nasal drip 09/29/2012  ? Gastro-esophageal reflux disease without esophagitis 06/30/2012  ? Dermatitis, seborrheic 02/27/2012  ? Sprain of unspecified site of sacroiliac region 08/10/2011  ? Bilateral bunions 06/25/2011  ? DJD of shoulder 04/11/2011  ? Fibromyalgia 08/02/2010  ? Hypercholesteremia 04/15/2009  ? Positive D-dimer 04/15/2009  ? History of gastroesophageal reflux (GERD) 04/14/2009  ? ? ?REFERRING DIAG: Low Back Pain/ Non-invasive decompression of the spine in October of 2022. ? ?THERAPY DIAG:  ?Pain in left hip ? ?Chronic bilateral low back pain without sciatica ? ?Muscle spasm of back ? ?Other abnormalities of gait and mobility ? ?PERTINENT HISTORY: Chronic kidney disease  ?Anxiety ;  Left leg weakness, Trigeminal Neuralgia; Left shoulder DDD; cn have pain on the right as well; fibromylagia  ? ?PRECAUTIONS: none ? ?SUBJECTIVE: Pt reports little pain today. ? ?PAIN:  ?Are you  having pain? Yes ?NPRS scale: 2/10 right now over the past week 9/10 at worst  ?Pain location: Across the lower back and into the hip. Can go into her anterior hip as well at times  ?Pain orientation: Left  ?PAIN TYPE: aching ?Pain description: constant  ?Aggravating factors: Patient has increased pain when she is standing for too long  ?Relieving factors: sitting down  ?  ?PRECAUTIONS: None ?  ?WEIGHT  BEARING RESTRICTIONS No ?  ?FALLS:  ?Has patient fallen in last 6 months? No, Number of falls:  ?  ?LIVING ENVIRONMENT: ?The patient has an upstairs to go into to get into and out of pain  ?  ?OCCUPATION: retired  ?  ?PLOF: Independent ?  ?Recreation: walking  ?  ?PATIENT GOALS  ?  ?Help the pain/ Keep from having surgery  ?  ?  ?  ?OBJECTIVE:  ?  ?DIAGNOSTIC FINDINGS:  ?Lumbar MRI: L5-S1 moderate to severe left lateral reccess stenois with S1 nerve impingement ?  ?Diffusely abnormal arrow signal throughout the visible spine and pelvis ?  ?PATIENT SURVEYS:  ?FOTO not given 2nd to cognition  ?  ?SCREENING FOR RED FLAGS: ?Bowel or bladder incontinence: No ?Spinal tumors: No ?Cauda equina syndrome: No ?Compression fracture: No ?Abdominal aneurysm: No ?  ?COGNITION: ?         Overall cognitive status: Difficulty to assess Could not recall where she lives but also reports she isnt from this area. Had to ask questions a few times. Speaks very quietly.                     ?          ?SENSATION: ?         Light touch: Appears intact ?         Stereognosis: Appears intact ?         Hot/Cold: Appears intact ?         Proprioception: Appears intact ?Denies paraesthesias  ?MUSCLE LENGTH: ?  ?POSTURE:  ?  ?  ?PALPATION: ?Significant spasming and tenderness to palpation in the left hip and lower back ?  ?LUMBARAROM/PROM ?  ?A/PROM A/PROM  ?08/14/2021  ?Flexion 70 with minor pain   ?Extension Increased pain just past neutral   ?Right lateral flexion Painful but difficult to tell if one way was more painful then the other  ?Left lateral flexion Painful but difficult to tell if one way was more painful then the other   ?Right rotation    ?Left rotation    ? (Blank rows = not tested) ?  ?LE AROM/PROM: ?  ?PROM Right ?08/14/2021 Left ?08/14/2021  ?Hip flexion 80 degrees before she felt pain  90 with significant pain. Therapy asked patient to make sure to let the therapist know when she was in pain.   ?Hip extension      ?Hip abduction       ?Hip adduction      ?Hip internal rotation      ?Hip external rotation      ?Knee flexion      ?Knee extension      ?Ankle dorsiflexion      ?Ankle plantarflexion      ?Ankle inversion      ?Ankle eversion      ? (Blank rows = not tested) ?  ?LE MMT: ?  ?MMT Right ?08/14/2021 Left ?08/14/2021  ?Hip flexion 3/5 3/5  ?Hip extension      ?Hip abduction  4/5 3+/5  ?Hip adduction      ?Hip internal rotation      ?Hip external rotation      ?Knee flexion      ?Knee extension 4+/5 4+/5 ?   ?Ankle dorsiflexion      ?Ankle plantarflexion      ?Ankle inversion      ?Ankle eversion      ?  ?GAIT: ?Very slow steps, lateral movement away from the left side; decreased single leg stance on the left  ?  ?TODAY'S TREATMENT  ?08/30/21 ? ?Pt seen for aquatic therapy today.  Treatment took place in water 3.25-4.8 ft in depth at the [facility] pool. Temp of water was 91?.  Pt entered/exited the pool via stairs (step through pattern) independently with bilat rail. ? ?Intro to setting ?EDU on properties of water and benefits of aquatic therapy ? ?Walking 4 widths in 3.6 ft forward, back and sidestepping with cga for confidence and safety. ? ?Seated on bench ?-Hamstring and gastroc stretching ea 2x20s ?-Lumbar rotations x10 ?-STS hands on kick board for ue support x10 ?-kick board push down x 5 ? ?Standing  ?Hip hinge with kickboard x 7-8 cues for LB stretch holding 3 reps x 20 second ?Lumbar rotations x5. Pt with difficulty maintainting standing balance ?-holding to wall; marching; add/abd x10 ? ?Pt requires buoyancy for support and to offload joints with strengthening exercises. Viscosity of the water is needed for resistance of strengthening; water current perturbations provides challenge to standing balance unsupported, requiring increased core activation.  ?  ?  ?  ?PATIENT EDUCATION:  ?Education details: HEP; symptom management  ?Person educated: Patient ?Education method: Explanation, Demonstration, Tactile cues, Verbal  cues, and Handouts ?Education comprehension: needs further education ?  ?  ?HOME EXERCISE PROGRAM: ?  ?  ?ASSESSMENT: ?  ?CLINICAL IMPRESSION: ?Pt with moderate instability with intial amb in water improve

## 2021-09-01 ENCOUNTER — Telehealth: Payer: Self-pay | Admitting: Hematology

## 2021-09-01 NOTE — Addendum Note (Signed)
Addended by: Sullivan Lone on: 09/01/2021 01:53 AM ? ? Modules accepted: Orders ? ?

## 2021-09-01 NOTE — Telephone Encounter (Signed)
.  Called patient to schedule appointment per 3/17 inbasket, patient is aware of date and time.   ?
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
## 2021-09-22 ENCOUNTER — Encounter (HOSPITAL_BASED_OUTPATIENT_CLINIC_OR_DEPARTMENT_OTHER): Payer: Self-pay | Admitting: Physical Therapy

## 2021-09-22 ENCOUNTER — Ambulatory Visit (HOSPITAL_BASED_OUTPATIENT_CLINIC_OR_DEPARTMENT_OTHER): Payer: Medicare Other | Attending: Sports Medicine | Admitting: Physical Therapy

## 2021-09-22 DIAGNOSIS — M62830 Muscle spasm of back: Secondary | ICD-10-CM | POA: Insufficient documentation

## 2021-09-22 DIAGNOSIS — G8929 Other chronic pain: Secondary | ICD-10-CM | POA: Insufficient documentation

## 2021-09-22 DIAGNOSIS — M545 Low back pain, unspecified: Secondary | ICD-10-CM | POA: Diagnosis present

## 2021-09-22 DIAGNOSIS — R2689 Other abnormalities of gait and mobility: Secondary | ICD-10-CM | POA: Insufficient documentation

## 2021-09-22 DIAGNOSIS — M25552 Pain in left hip: Secondary | ICD-10-CM | POA: Insufficient documentation

## 2021-09-22 NOTE — Therapy (Addendum)
?OUTPATIENT PHYSICAL THERAPY TREATMENT NOTE ? ? ?Patient Name: Angelica James ?MRN: [id number] ?DOB:10-10-1945, 76 y.o., female ?Today's Date: 09/22/2021 ? ?PCP: Pcp, No ?REFERRING PROVIDER: Verner Chol, MD ? ? PT End of Session - 09/22/21 1008   ? ? Visit Number 3   ? Number of Visits 12   ? Date for PT Re-Evaluation 09/25/21   ? Authorization Type [employer] [place]   ? PT Start Time 1021   ? PT Stop Time 1105   ? PT Time Calculation (min) 44 min   ? Activity Tolerance Patient tolerated treatment well   ? Behavior During Therapy [facility] for tasks assessed/performed   ? ?  ?  ? ?  ? ? ?Past Medical History:  ?Diagnosis Date  ? Chronic kidney disease   ? ?Past Surgical History:  ?Procedure Laterality Date  ? ABDOMINAL HYSTERECTOMY    ? BACK SURGERY    ? HERNIA REPAIR    ? KNEE SURGERY    ? ?Patient Active Problem List  ? Diagnosis Date Noted  ? Abscess of left genital labia 02/06/2021  ? Trochanteric bursitis of both hips 01/05/2021  ? Elevated BP without diagnosis of hypertension 10/05/2020  ? Breast pain, left 07/01/2020  ? Sciatica, left side 07/01/2020  ? Acute pain of left shoulder 01/07/2020  ? Carpal tunnel syndrome on left 12/14/2019  ? Stage 3a chronic kidney disease ([place]) 12/14/2019  ? Posterior vitreous detachment of right eye 09/14/2019  ? Monocytosis 11/17/2018  ? Prediabetes 11/17/2018  ? Persistent depressive disorder with anxious distress, currently mild 05/05/2018  ? Trigeminal neuralgia of right side of face 05/05/2018  ? Abnormality of gait 03/10/2018  ? Enthesopathy 03/10/2018  ? Family history of diabetes mellitus 03/10/2018  ? Hip pain, right 03/10/2018  ? Non-seasonal allergic rhinitis due to pollen 03/10/2018  ? Other bursitis disorders 03/10/2018  ? Other dyspnea and respiratory abnormality 03/10/2018  ? Primary localized osteoarthrosis, lower leg 03/10/2018  ? Spinal stenosis of lumbar region without neurogenic claudication 03/10/2018  ? Symptomatic menopausal or  female climacteric states 03/10/2018  ? Transient arterial retinal occlusion 03/10/2018  ? S/P chole 02/05/2018  ? Vasomotor symptoms due to menopause 11/25/2017  ? Left leg weakness 06/12/2017  ? Influenza B 04/24/2017  ? Malaise and fatigue 04/24/2017  ? Other specified anxiety disorders 11/08/2016  ? Calculus of gallbladder without cholecystitis without obstruction 07/10/2016  ? Nausea in adult 07/10/2016  ? Increased frequency of urination 07/04/2016  ? Incisional hernia, without obstruction or gangrene 10/10/2015  ? Diverticulitis of sigmoid colon 08/17/2014  ? Dorsalgia, unspecified 06/28/2014  ? Wrist pain, left 02/23/2014  ? Muscle spasms of neck 11/26/2013  ? Chronic kidney disease, stage II (mild) 07/10/2013  ? Hyperkalemia 07/10/2013  ? Obesity (BMI 30-39.9) 09/30/2012  ? Insomnia 09/29/2012  ? Post-nasal drip 09/29/2012  ? Gastro-esophageal reflux disease without esophagitis 06/30/2012  ? Dermatitis, seborrheic 02/27/2012  ? Sprain of unspecified site of sacroiliac region 08/10/2011  ? Bilateral bunions 06/25/2011  ? DJD of shoulder 04/11/2011  ? Fibromyalgia 08/02/2010  ? Hypercholesteremia 04/15/2009  ? Positive D-dimer 04/15/2009  ? History of gastroesophageal reflux (GERD) 04/14/2009  ? ? ?REFERRING DIAG: Low Back Pain/ Non-invasive decompression of the spine in October of 2022. ? ?THERAPY DIAG:  ?Pain in left hip ? ?Chronic bilateral low back pain without sciatica ? ?Muscle spasm of back ? ?Other abnormalities of gait and mobility ? ?PERTINENT HISTORY: Chronic kidney disease  ?Anxiety ;  Left leg weakness, Trigeminal Neuralgia;  Left shoulder DDD; cn have pain on the right as well; fibromylagia  ? ?PRECAUTIONS: none ? ?SUBJECTIVE: "back pain still there". ? ?PAIN:  ?Are you having pain? No ?NPRS scale: 0/10  ?Pain location: Across the lower back and into the hip. Can go into her anterior hip as well at times  ?Pain orientation: Left  ?PAIN TYPE: aching ?Pain description: constant  ?Aggravating  factors: Patient has increased pain when she is standing for too long  ?Relieving factors: sitting down  ?  ?PRECAUTIONS: None ?  ?WEIGHT BEARING RESTRICTIONS No ?  ?FALLS:  ?Has patient fallen in last 6 months? No, Number of falls:  ?  ?LIVING ENVIRONMENT: ?The patient has an upstairs to go into to get into and out of pain  ?  ?OCCUPATION: retired  ?  ?PLOF: Independent ?  ?Recreation: walking  ?  ?PATIENT GOALS  ?  ?Help the pain/ Keep from having surgery  ?  ?  ?  ?OBJECTIVE:  ?  ?DIAGNOSTIC FINDINGS:  ?Lumbar MRI: L5-S1 moderate to severe left lateral reccess stenois with S1 nerve impingement ?  ?Diffusely abnormal arrow signal throughout the visible spine and pelvis ?  ?PATIENT SURVEYS:  ?FOTO not given 2nd to cognition  ?  ?SCREENING FOR RED FLAGS: ?Bowel or bladder incontinence: No ?Spinal tumors: No ?Cauda equina syndrome: No ?Compression fracture: No ?Abdominal aneurysm: No ?  ?COGNITION: ?         Overall cognitive status: Difficulty to assess Could not recall where she lives but also reports she isnt from this area. Had to ask questions a few times. Speaks very quietly.                     ?          ?SENSATION: ?         Light touch: Appears intact ?         Stereognosis: Appears intact ?         Hot/Cold: Appears intact ?         Proprioception: Appears intact ?Denies paraesthesias  ?MUSCLE LENGTH: ?  ?POSTURE:  ?  ?  ?PALPATION: ?Significant spasming and tenderness to palpation in the left hip and lower back ?  ?LUMBARAROM/PROM ?  ?A/PROM A/PROM  ?08/14/2021 AROM ?09/22/21  ?Flexion 70 with minor pain  85  ?Extension Increased pain just past neutral  10  ?Right lateral flexion Painful but difficult to tell if one way was more painful then the other 50%  ?Left lateral flexion Painful but difficult to tell if one way was more painful then the other  50%  ?Right rotation     ?Left rotation     ? (Blank rows = not tested) ?  ?LE AROM/PROM: ?  ?PROM Right ?08/14/2021 Left ?08/14/2021 Right ?09/22/21 Left ?09/22/21   ?Hip flexion 80 degrees before she felt pain  90 with significant pain. Therapy asked patient to make sure to let the therapist know when she was in pain.  100 110  ?Hip extension        ?Hip abduction        ?Hip adduction        ?Hip internal rotation        ?Hip external rotation        ?Knee flexion        ?Knee extension        ?Ankle dorsiflexion        ?Ankle plantarflexion        ?  Ankle inversion        ?Ankle eversion        ? (Blank rows = not tested) ?  ?LE MMT: ?  ?MMT Right ?08/14/2021 Left ?08/14/2021 Right ?09/22/21 Left ?09/22/21  ?Hip flexion 3/5 3/5 3+ 3+  ?Hip extension        ?Hip abduction 4/5 3+/5 4+ 4  ?Hip adduction        ?Hip internal rotation        ?Hip external rotation        ?Knee flexion        ?Knee extension 4+/5 4+/5 ?  5 5  ?Ankle dorsiflexion        ?Ankle plantarflexion        ?Ankle inversion        ?Ankle eversion        ?  ?GAIT: ?Very slow steps, lateral movement away from the left side; decreased single leg stance on the left  ?  ?TODAY'S TREATMENT  ?09/22/21 ? ?Pt seen for aquatic therapy today.  Treatment took place in water 3.25-4.8 ft in depth at the [facility] pool. Temp of water was 91?.  Pt entered/exited the pool via stairs (step through pattern) independently with bilat rail. ? ?  ?Walking 4 widths in 3.6 ft forward, back and sidestepping with cga-supervision for confidence and safety. ? ?Seated on bench ?-Hamstring and gastroc stretching ea 2x20s ?-STS x10 ue supported on noodle then x10 without UE support.  Cues for weight shifting and upright position. ?-Lumbar stretches seated with noodle x10 5-10s hold ?Lumbar rotations x5 with 20s hold ? ? ?Standing ?Holding to wall: marching 2x10; add/abd x10; hip circle CW and CCW x10 ea R/L; hip extension x10 ?Blue noodle push down for core engagement x10 10s hold progressed to yellow noodle x 5 x 10s hold ? ?Pt requires buoyancy for support and to offload joints with strengthening exercises. Viscosity of the water is  needed for resistance of strengthening; water current perturbations provides challenge to standing balance unsupported, requiring increased core activation.  ?  ?  ?  ?PATIENT EDUCATION:  ?Education details: HE[facility]

## 2021-09-29 ENCOUNTER — Other Ambulatory Visit: Payer: Self-pay

## 2021-09-29 ENCOUNTER — Ambulatory Visit (HOSPITAL_COMMUNITY)
Admission: RE | Admit: 2021-09-29 | Discharge: 2021-09-29 | Disposition: A | Discharge status: Home / Self Care | Payer: Medicare Other | Source: Ambulatory Visit | Attending: Hematology | Admitting: Hematology

## 2021-09-29 ENCOUNTER — Encounter (HOSPITAL_COMMUNITY): Payer: Self-pay

## 2021-09-29 ENCOUNTER — Ambulatory Visit (HOSPITAL_BASED_OUTPATIENT_CLINIC_OR_DEPARTMENT_OTHER): Payer: Medicare Other | Admitting: Physical Therapy

## 2021-09-29 DIAGNOSIS — D7589 Other specified diseases of blood and blood-forming organs: Secondary | ICD-10-CM | POA: Diagnosis not present

## 2021-09-29 DIAGNOSIS — D72829 Elevated white blood cell count, unspecified: Secondary | ICD-10-CM | POA: Insufficient documentation

## 2021-09-29 DIAGNOSIS — R898 Other abnormal findings in specimens from other organs, systems and tissues: Secondary | ICD-10-CM | POA: Diagnosis not present

## 2021-09-29 DIAGNOSIS — R9389 Abnormal findings on diagnostic imaging of other specified body structures: Secondary | ICD-10-CM | POA: Insufficient documentation

## 2021-09-29 DIAGNOSIS — N189 Chronic kidney disease, unspecified: Secondary | ICD-10-CM | POA: Diagnosis not present

## 2021-09-29 DIAGNOSIS — F32A Depression, unspecified: Secondary | ICD-10-CM | POA: Diagnosis not present

## 2021-09-29 DIAGNOSIS — M4800 Spinal stenosis, site unspecified: Secondary | ICD-10-CM | POA: Insufficient documentation

## 2021-09-29 DIAGNOSIS — Q998 Other specified chromosome abnormalities: Secondary | ICD-10-CM | POA: Insufficient documentation

## 2021-09-29 DIAGNOSIS — F419 Anxiety disorder, unspecified: Secondary | ICD-10-CM | POA: Insufficient documentation

## 2021-09-29 DIAGNOSIS — D72821 Monocytosis (symptomatic): Secondary | ICD-10-CM | POA: Insufficient documentation

## 2021-09-29 DIAGNOSIS — D75839 Thrombocytosis, unspecified: Secondary | ICD-10-CM | POA: Diagnosis not present

## 2021-09-29 DIAGNOSIS — D720 Genetic anomalies of leukocytes: Secondary | ICD-10-CM | POA: Insufficient documentation

## 2021-09-29 DIAGNOSIS — D474 Osteomyelofibrosis: Secondary | ICD-10-CM | POA: Insufficient documentation

## 2021-09-29 DIAGNOSIS — D751 Secondary polycythemia: Secondary | ICD-10-CM | POA: Diagnosis not present

## 2021-09-29 LAB — CBC WITH DIFFERENTIAL/PLATELET
Abs Immature Granulocytes: 0.16 10*3/uL — ABNORMAL HIGH (ref 0.00–0.07)
Basophils Absolute: 0.1 10*3/uL (ref 0.0–0.1)
Basophils Relative: 1 %
Eosinophils Absolute: 0.2 10*3/uL (ref 0.0–0.5)
Eosinophils Relative: 1 %
HCT: 48 % — ABNORMAL HIGH (ref 36.0–46.0)
Hemoglobin: 15.3 g/dL — ABNORMAL HIGH (ref 12.0–15.0)
Immature Granulocytes: 1 %
Lymphocytes Relative: 16 %
Lymphs Abs: 2.5 10*3/uL (ref 0.7–4.0)
MCH: 29.8 pg (ref 26.0–34.0)
MCHC: 31.9 g/dL (ref 30.0–36.0)
MCV: 93.4 fL (ref 80.0–100.0)
Monocytes Absolute: 2.4 10*3/uL — ABNORMAL HIGH (ref 0.1–1.0)
Monocytes Relative: 15 %
Neutro Abs: 10.3 10*3/uL — ABNORMAL HIGH (ref 1.7–7.7)
Neutrophils Relative %: 66 %
Platelets: 350 10*3/uL (ref 150–400)
RBC: 5.14 MIL/uL — ABNORMAL HIGH (ref 3.87–5.11)
RDW: 13.8 % (ref 11.5–15.5)
WBC: 15.7 10*3/uL — ABNORMAL HIGH (ref 4.0–10.5)
nRBC: 0 % (ref 0.0–0.2)

## 2021-09-29 MED ORDER — MIDAZOLAM HCL 2 MG/2ML IJ SOLN
INTRAMUSCULAR | Status: AC | PRN
  Administered 2021-09-29: 1 mg via INTRAVENOUS

## 2021-09-29 MED ORDER — LIDOCAINE HCL (PF) 1 % IJ SOLN
INTRAMUSCULAR | Status: AC | PRN
  Administered 2021-09-29: 10 mL via INTRADERMAL

## 2021-09-29 MED ORDER — SODIUM CHLORIDE 0.9 % IV SOLN: INTRAVENOUS | Status: DC

## 2021-09-29 MED ORDER — NALOXONE HCL 0.4 MG/ML IJ SOLN
INTRAMUSCULAR | Status: AC
  Filled 2021-09-29: qty 1

## 2021-09-29 MED ORDER — MIDAZOLAM HCL 2 MG/2ML IJ SOLN
INTRAMUSCULAR | Status: AC
  Filled 2021-09-29: qty 4

## 2021-09-29 MED ORDER — FENTANYL CITRATE (PF) 100 MCG/2ML IJ SOLN
INTRAMUSCULAR | Status: AC | PRN
  Administered 2021-09-29: 50 ug via INTRAVENOUS

## 2021-09-29 MED ORDER — FENTANYL CITRATE (PF) 100 MCG/2ML IJ SOLN
INTRAMUSCULAR | Status: AC
  Filled 2021-09-29: qty 2

## 2021-09-29 MED ORDER — FLUMAZENIL 0.5 MG/5ML IV SOLN
INTRAVENOUS | Status: AC
  Filled 2021-09-29: qty 5

## 2021-09-29 NOTE — Discharge Instructions (Signed)

## 2021-09-29 NOTE — Consult Note (Signed)
? ?Chief Complaint: ?Patient was seen in consultation today for  CT guided bone marrow biopsy ? ?Referring Physician(s): ?Kale,Gautam Kishore ? ?Supervising Physician: Michaelle Birks ? ?Patient Status: [place] ? ?History of Present Illness: ?Angelica James is a 76 y.o. female with past medical history significant for chronic kidney disease, depression, anxiety, spinal stenosis who presents now with abnormal bone marrow infiltrative change in the spine and pelvis on MRI, leukocytosis with neutrophilia and monocytosis as well as thrombocytosis.  She is scheduled today for CT-guided bone marrow biopsy to rule out myeloproliferative neoplasm/MDS/CMML. ? ? ?Past Medical History:  ?Diagnosis Date  ? Chronic kidney disease   ? ? ?Past Surgical History:  ?Procedure Laterality Date  ? ABDOMINAL HYSTERECTOMY    ? BACK SURGERY    ? HERNIA REPAIR    ? KNEE SURGERY    ? ? ?Allergies: ?Penicillins and Meloxicam ? ?Medications: ?Prior to Admission medications   ?Medication Sig Start Date End Date Taking? Authorizing Provider  ?acetaminophen (TYLENOL) 650 MG CR tablet Take 1 tablet by mouth daily. 04/18/09  Yes [provider]  ?albuterol (VENTOLIN HFA) 108 (90 Base) MCG/ACT inhaler Inhale into the lungs. 01/05/21  Yes [provider]  ?aspirin 81 MG EC tablet Take by mouth.   Yes [provider]  ?cetirizine (ZYRTEC) 10 MG tablet Take by mouth. 10/05/20  Yes [provider]  ?Cholecalciferol 25 MCG (1000 UT) tablet Take by mouth.   Yes [provider]  ?cyclobenzaprine (FLEXERIL) 10 MG tablet Take by mouth. 11/08/16  Yes [provider]  ?Docusate Sodium (DSS) 100 MG CAPS Take by mouth. 11/24/15  Yes [provider]  ?fluticasone (FLONASE) 50 MCG/ACT nasal spray Place into the nose. 07/10/13  Yes [provider]  ?gabapentin (NEURONTIN) 100 MG capsule Take by mouth. 07/01/20  Yes [provider]  ?linaclotide Rolan Lipa) 290 MCG CAPS capsule Take by  mouth. 02/13/18  Yes [provider]  ?omeprazole (PRILOSEC) 40 MG capsule Take by mouth. 12/10/20  Yes [provider]  ?  ? ?History reviewed. No pertinent family history. ? ?Social History  ? ?Socioeconomic History  ? Marital status: Single  ?  Spouse name: Not on file  ? Number of children: Not on file  ? Years of education: Not on file  ? Highest education level: Not on file  ?Occupational History  ? Not on file  ?Tobacco Use  ? Smoking status: Former  ?  Types: Cigarettes  ? Smokeless tobacco: Never  ?Substance and Sexual Activity  ? Alcohol use: Not on file  ? Drug use: Not on file  ? Sexual activity: Not on file  ?Other Topics Concern  ? Not on file  ?Social History Narrative  ? Not on file  ? ?Social Determinants of Health  ? ?Financial Resource Strain: Not on file  ?Food Insecurity: Not on file  ?Transportation Needs: Not on file  ?Physical Activity: Not on file  ?Stress: Not on file  ?Social Connections: Not on file  ? ? ? ? ?Review of Systems patient denies fever, headache, chest pain, dyspnea, cough, abdominal/back pain, nausea, vomiting or bleeding ? ?Vital Signs: ?BP 134/72   Pulse 80   Temp 98.4 ?F (36.9 ?C) (Oral)   Resp 18   Ht 5' 8"  (1.727 m)   Wt 194 lb (88 kg)   SpO2 99%   BMI 29.50 kg/m?  ? ?Physical Exam awake, alert.  Chest clear to auscultation bilaterally.  Heart with regular rate and rhythm.  Abdomen soft, positive bowel sounds, nontender.  No lower extremity edema ? ?Imaging: ?No results found. ? ?Labs: ? ?CBC: ?Recent Labs  ?  08/10/21 ?1156 09/29/21 ?0739  ?WBC 18.6* 15.7*  ?HGB 14.1 15.3*  ?HCT 42.9 48.0*  ?PLT 420* 350  ? ? ?COAGS: ?No results for input(s): INR, APTT in the last 8760 hours. ? ?BMP: ?Recent Labs  ?  08/10/21 ?1156  ?NA 141  ?K 3.3*  ?CL 102  ?CO2 32  ?GLUCOSE 85  ?BUN 18  ?CALCIUM 10.2  ?CREATININE 0.99  ?GFRNONAA 59*  ? ? ?LIVER FUNCTION TESTS: ?Recent Labs  ?  08/10/21 ?1156  ?BILITOT 0.9  ?AST 17  ?ALT 16  ?ALKPHOS 66  ?PROT 8.3*  ?ALBUMIN 4.9   ? ? ?TUMOR MARKERS: ?No results for input(s): AFPTM, CEA, CA199, CHROMGRNA in the last 8760 hours. ? ?Assessment and Plan: ?76 y.o. female with past medical history significant for chronic kidney disease, depression, anxiety, spinal stenosis who presents now with abnormal bone marrow infiltrative change in the spine and pelvis on MRI, leukocytosis with neutrophilia and monocytosis as well as thrombocytosis.  She is scheduled today for CT-guided bone marrow biopsy to rule out myeloproliferative neoplasm/MDS/CMML.Risks and benefits of procedure was discussed with the patient  including, but not limited to bleeding, infection, damage to adjacent structures or low yield requiring additional tests. ? ?All of the questions were answered and there is agreement to proceed. ? ?Consent signed and in chart. ? ? ? ?Thank you for this interesting consult.  I greatly enjoyed meeting Rita Vialpando and look forward to participating in their care.  A copy of this report was sent to the requesting provider on this date. ? ?Electronically Signed: ?Autumn Messing, PA-C ?09/29/2021, 7:54 AM ? ? ?I spent a total of  20 minutes   in face to face in clinical consultation, greater than 50% of which was counseling/coordinating care for CT guided bone marrow biopsy ? ?

## 2021-09-29 NOTE — Procedures (Signed)
Vascular and Interventional Radiology Procedure Note ? ?Patient: Angelica James ?DOB: 02-23-1946 ?Medical Record Number: [id number] ?Note Date/Time: 09/29/21 9:19 AM  ? ?Performing Physician: Michaelle Birks, MD ?Assistant(s): None ? ?Diagnosis: Marrow infiltration on MR. ? ?Procedure: BONE MARROW ASPIRATION AND BIOPSY ? ?Anesthesia: Conscious Sedation ?Complications: None ?Estimated Blood Loss: Minimal ?Specimens: Sent for Cytology and Pathology ? ?Findings:  ?Successful CT-guided bone marrow biopsy ?A total of 1 cores were obtained. ?Hemostasis of the tract was achieved using Manual Pressure. ? ?Plan: Bed rest for 1 hours. ? ?See detailed procedure note with images in PACS. ?The patient tolerated the procedure well without incident or complication and was returned to Recovery in stable condition.  ? ? ?Michaelle Birks, MD ?[facility] ?[facility] ? ? ?Pager. [phone] ?Clinic. [phone]  ?

## 2021-10-03 ENCOUNTER — Other Ambulatory Visit: Payer: Self-pay

## 2021-10-03 ENCOUNTER — Inpatient Hospital Stay: Payer: Medicare Other | Attending: Hematology | Admitting: Hematology

## 2021-10-03 DIAGNOSIS — Z880 Allergy status to penicillin: Secondary | ICD-10-CM | POA: Insufficient documentation

## 2021-10-03 DIAGNOSIS — Z87891 Personal history of nicotine dependence: Secondary | ICD-10-CM | POA: Insufficient documentation

## 2021-10-03 DIAGNOSIS — F0280 Dementia in other diseases classified elsewhere without behavioral disturbance: Secondary | ICD-10-CM | POA: Insufficient documentation

## 2021-10-03 DIAGNOSIS — G20 Parkinson's disease: Secondary | ICD-10-CM | POA: Insufficient documentation

## 2021-10-03 DIAGNOSIS — N1831 Chronic kidney disease, stage 3a: Secondary | ICD-10-CM | POA: Insufficient documentation

## 2021-10-03 DIAGNOSIS — C9310 Chronic myelomonocytic leukemia not having achieved remission: Secondary | ICD-10-CM | POA: Insufficient documentation

## 2021-10-03 DIAGNOSIS — R898 Other abnormal findings in specimens from other organs, systems and tissues: Secondary | ICD-10-CM

## 2021-10-03 DIAGNOSIS — Z800 Family history of malignant neoplasm of digestive organs: Secondary | ICD-10-CM | POA: Insufficient documentation

## 2021-10-03 DIAGNOSIS — Z803 Family history of malignant neoplasm of breast: Secondary | ICD-10-CM | POA: Insufficient documentation

## 2021-10-03 DIAGNOSIS — D72829 Elevated white blood cell count, unspecified: Secondary | ICD-10-CM | POA: Insufficient documentation

## 2021-10-03 DIAGNOSIS — Z888 Allergy status to other drugs, medicaments and biological substances status: Secondary | ICD-10-CM | POA: Insufficient documentation

## 2021-10-03 DIAGNOSIS — Z79899 Other long term (current) drug therapy: Secondary | ICD-10-CM | POA: Insufficient documentation

## 2021-10-04 LAB — SURGICAL PATHOLOGY

## 2021-10-05 NOTE — Progress Notes (Signed)
This encounter was created in error - please disregard.

## 2021-10-06 ENCOUNTER — Inpatient Hospital Stay (HOSPITAL_BASED_OUTPATIENT_CLINIC_OR_DEPARTMENT_OTHER): Payer: Medicare Other | Admitting: Hematology

## 2021-10-06 ENCOUNTER — Encounter (HOSPITAL_BASED_OUTPATIENT_CLINIC_OR_DEPARTMENT_OTHER): Payer: Self-pay | Admitting: Physical Therapy

## 2021-10-06 ENCOUNTER — Ambulatory Visit (HOSPITAL_BASED_OUTPATIENT_CLINIC_OR_DEPARTMENT_OTHER): Payer: Medicare Other | Admitting: Physical Therapy

## 2021-10-06 DIAGNOSIS — M62830 Muscle spasm of back: Secondary | ICD-10-CM

## 2021-10-06 DIAGNOSIS — C9310 Chronic myelomonocytic leukemia not having achieved remission: Secondary | ICD-10-CM

## 2021-10-06 DIAGNOSIS — Z87891 Personal history of nicotine dependence: Secondary | ICD-10-CM | POA: Diagnosis not present

## 2021-10-06 DIAGNOSIS — Z800 Family history of malignant neoplasm of digestive organs: Secondary | ICD-10-CM | POA: Diagnosis not present

## 2021-10-06 DIAGNOSIS — G20 Parkinson's disease: Secondary | ICD-10-CM | POA: Insufficient documentation

## 2021-10-06 DIAGNOSIS — Z79899 Other long term (current) drug therapy: Secondary | ICD-10-CM | POA: Diagnosis not present

## 2021-10-06 DIAGNOSIS — Z803 Family history of malignant neoplasm of breast: Secondary | ICD-10-CM | POA: Diagnosis not present

## 2021-10-06 DIAGNOSIS — F0280 Dementia in other diseases classified elsewhere without behavioral disturbance: Secondary | ICD-10-CM | POA: Insufficient documentation

## 2021-10-06 DIAGNOSIS — Z880 Allergy status to penicillin: Secondary | ICD-10-CM | POA: Insufficient documentation

## 2021-10-06 DIAGNOSIS — M25552 Pain in left hip: Secondary | ICD-10-CM

## 2021-10-06 DIAGNOSIS — Z888 Allergy status to other drugs, medicaments and biological substances status: Secondary | ICD-10-CM | POA: Diagnosis not present

## 2021-10-06 DIAGNOSIS — D72829 Elevated white blood cell count, unspecified: Secondary | ICD-10-CM | POA: Diagnosis not present

## 2021-10-06 DIAGNOSIS — N1831 Chronic kidney disease, stage 3a: Secondary | ICD-10-CM | POA: Diagnosis not present

## 2021-10-06 DIAGNOSIS — G8929 Other chronic pain: Secondary | ICD-10-CM

## 2021-10-06 DIAGNOSIS — R2689 Other abnormalities of gait and mobility: Secondary | ICD-10-CM

## 2021-10-06 NOTE — Therapy (Signed)
?OUTPATIENT PHYSICAL THERAPY TREATMENT NOTE ? ? ?Patient Name: Angelica James ?MRN: [id number] ?DOB:1945/12/13, 76 y.o., female ?Today's Date: 10/06/2021 ? ?PCP: Pcp, No ?REFERRING PROVIDER: Verner Chol, MD ? ? PT End of Session - 10/06/21 1520   ? ? Visit Number 4   ? Number of Visits 19   ? Date for PT Re-Evaluation 11/17/21   ? Authorization Type [employer] [place]   ? PT Start Time 1519   ? PT Stop Time 1600   ? PT Time Calculation (min) 41 min   ? Activity Tolerance Patient tolerated treatment well   ? Behavior During Therapy [facility] for tasks assessed/performed   ? ?  ?  ? ?  ? ? ?Past Medical History:  ?Diagnosis Date  ? Chronic kidney disease   ? ?Past Surgical History:  ?Procedure Laterality Date  ? ABDOMINAL HYSTERECTOMY    ? BACK SURGERY    ? HERNIA REPAIR    ? KNEE SURGERY    ? ?Patient Active Problem List  ? Diagnosis Date Noted  ? Abscess of left genital labia 02/06/2021  ? Trochanteric bursitis of both hips 01/05/2021  ? Elevated BP without diagnosis of hypertension 10/05/2020  ? Breast pain, left 07/01/2020  ? Sciatica, left side 07/01/2020  ? Acute pain of left shoulder 01/07/2020  ? Carpal tunnel syndrome on left 12/14/2019  ? Stage 3a chronic kidney disease ([place]) 12/14/2019  ? Posterior vitreous detachment of right eye 09/14/2019  ? Monocytosis 11/17/2018  ? Prediabetes 11/17/2018  ? Persistent depressive disorder with anxious distress, currently mild 05/05/2018  ? Trigeminal neuralgia of right side of face 05/05/2018  ? Abnormality of gait 03/10/2018  ? Enthesopathy 03/10/2018  ? Family history of diabetes mellitus 03/10/2018  ? Hip pain, right 03/10/2018  ? Non-seasonal allergic rhinitis due to pollen 03/10/2018  ? Other bursitis disorders 03/10/2018  ? Other dyspnea and respiratory abnormality 03/10/2018  ? Primary localized osteoarthrosis, lower leg 03/10/2018  ? Spinal stenosis of lumbar region without neurogenic claudication 03/10/2018  ? Symptomatic menopausal or  female climacteric states 03/10/2018  ? Transient arterial retinal occlusion 03/10/2018  ? S/P chole 02/05/2018  ? Vasomotor symptoms due to menopause 11/25/2017  ? Left leg weakness 06/12/2017  ? Influenza B 04/24/2017  ? Malaise and fatigue 04/24/2017  ? Other specified anxiety disorders 11/08/2016  ? Calculus of gallbladder without cholecystitis without obstruction 07/10/2016  ? Nausea in adult 07/10/2016  ? Increased frequency of urination 07/04/2016  ? Incisional hernia, without obstruction or gangrene 10/10/2015  ? Diverticulitis of sigmoid colon 08/17/2014  ? Dorsalgia, unspecified 06/28/2014  ? Wrist pain, left 02/23/2014  ? Muscle spasms of neck 11/26/2013  ? Chronic kidney disease, stage II (mild) 07/10/2013  ? Hyperkalemia 07/10/2013  ? Obesity (BMI 30-39.9) 09/30/2012  ? Insomnia 09/29/2012  ? Post-nasal drip 09/29/2012  ? Gastro-esophageal reflux disease without esophagitis 06/30/2012  ? Dermatitis, seborrheic 02/27/2012  ? Sprain of unspecified site of sacroiliac region 08/10/2011  ? Bilateral bunions 06/25/2011  ? DJD of shoulder 04/11/2011  ? Fibromyalgia 08/02/2010  ? Hypercholesteremia 04/15/2009  ? Positive D-dimer 04/15/2009  ? History of gastroesophageal reflux (GERD) 04/14/2009  ? ? ?REFERRING DIAG: Low Back Pain/ Non-invasive decompression of the spine in October of 2022. ? ?THERAPY DIAG:  ?Pain in left hip ? ?Chronic bilateral low back pain without sciatica ? ?Muscle spasm of back ? ?Other abnormalities of gait and mobility ? ?PERTINENT HISTORY: Chronic kidney disease  ?Anxiety ;  Left leg weakness, Trigeminal Neuralgia;  Left shoulder DDD; cn have pain on the right as well; fibromylagia  ? ?PRECAUTIONS: none ? ?SUBJECTIVE: "Today is a pretty good day."  ? ?PAIN:  ?Are you having pain? Yes ?NPRS scale: 6/10  ?Pain location: Across the lower back and into the hip. Can go into her anterior hip as well at times  ?Pain orientation: Left  ?PAIN TYPE: aching ?Pain description: constant  ?Aggravating  factors: Patient has increased pain when she is standing for too long  ?Relieving factors: sitting down  ?  ?PRECAUTIONS: None ?  ?WEIGHT BEARING RESTRICTIONS No ?  ?FALLS:  ?Has patient fallen in last 6 months? No, Number of falls:  ?  ?LIVING ENVIRONMENT: ?The patient has an upstairs to go into to get into and out of pain  ?  ?OCCUPATION: retired  ?  ?PLOF: Independent ?  ?Recreation: walking  ?  ?PATIENT GOALS  ?  ?Help the pain/ Keep from having surgery  ?  ?  ?  ?OBJECTIVE:  ?  ?DIAGNOSTIC FINDINGS:  ?Lumbar MRI: L5-S1 moderate to severe left lateral reccess stenois with S1 nerve impingement ?  ?Diffusely abnormal arrow signal throughout the visible spine and pelvis ?  ?PATIENT SURVEYS:  ?FOTO not given 2nd to cognition  ?  ?SCREENING FOR RED FLAGS: ?Bowel or bladder incontinence: No ?Spinal tumors: No ?Cauda equina syndrome: No ?Compression fracture: No ?Abdominal aneurysm: No ?  ?COGNITION: ?         Overall cognitive status: Difficulty to assess Could not recall where she lives but also reports she isnt from this area. Had to ask questions a few times. Speaks very quietly.                     ?          ?SENSATION: ?         Light touch: Appears intact ?         Stereognosis: Appears intact ?         Hot/Cold: Appears intact ?         Proprioception: Appears intact ?Denies paraesthesias  ?MUSCLE LENGTH: ?  ?POSTURE:  ?  ?  ?PALPATION: ?Significant spasming and tenderness to palpation in the left hip and lower back ?  ?LUMBARAROM/PROM ?  ?A/PROM A/PROM  ?08/14/2021 AROM ?09/22/21  ?Flexion 70 with minor pain  85  ?Extension Increased pain just past neutral  10  ?Right lateral flexion Painful but difficult to tell if one way was more painful then the other 50%  ?Left lateral flexion Painful but difficult to tell if one way was more painful then the other  50%  ?Right rotation     ?Left rotation     ? (Blank rows = not tested) ?  ?LE AROM/PROM: ?  ?PROM Right ?08/14/2021 Left ?08/14/2021 Right ?09/22/21 Left ?09/22/21   ?Hip flexion 80 degrees before she felt pain  90 with significant pain. Therapy asked patient to make sure to let the therapist know when she was in pain.  100 110  ?Hip extension        ?Hip abduction        ?Hip adduction        ?Hip internal rotation        ?Hip external rotation        ?Knee flexion        ?Knee extension        ?Ankle dorsiflexion        ?Ankle plantarflexion        ?  Ankle inversion        ?Ankle eversion        ? (Blank rows = not tested) ?  ?LE MMT: ?  ?MMT Right ?08/14/2021 Left ?08/14/2021 Right ?09/22/21 Left ?09/22/21  ?Hip flexion 3/5 3/5 3+ 3+  ?Hip extension        ?Hip abduction 4/5 3+/5 4+ 4  ?Hip adduction        ?Hip internal rotation        ?Hip external rotation        ?Knee flexion        ?Knee extension 4+/5 4+/5 ?  5 5  ?Ankle dorsiflexion        ?Ankle plantarflexion        ?Ankle inversion        ?Ankle eversion        ?  ?GAIT: ?Very slow steps, lateral movement away from the left side; decreased single leg stance on the left  ?  ?TODAY'S TREATMENT  ? ?Pt seen for aquatic therapy today.  Treatment took place in water 3.25-4.8 ft in depth at the [facility] pool. Temp of water was 91?.  Pt entered/exited the pool via stairs (step through pattern) independently with bilat rail. ? ?Walking 3 laps in 4 ft deep water - forward, backward, sidestepping, and high knee marching forward with UE on aqua jogger barbell and CGA-SBA for confidence and safety. ? ?Holding onto wall:   Heel/toe raises x 12 ?   Hip circles CW x 12 each, with demo / cues for form ?   Squats x 12  ?   Calf stretch (runners) x 15s x 2 reps each leg ?   Side to side lunges for adductor stretch x 5 each ? ?Seated on bench: ?- sit to/from stand without UE support x 10 ?- sit to/from stand with side step R/L (traversing the entire bench) ?-LAQ with DF for dynamic LE stretch x 10 each leg  ? ?Standing- tandem balance without UE support x 15s ?Seated balance on yellow pool noodle between legs with HHA for  confidence ? ?Pt requires buoyancy for support and to offload joints with strengthening exercises. Viscosity of the water is needed for resistance of strengthening; water current perturbations provides challen

## 2021-10-09 ENCOUNTER — Encounter (HOSPITAL_COMMUNITY): Payer: Self-pay | Admitting: Hematology
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
## 2021-10-13 ENCOUNTER — Ambulatory Visit (HOSPITAL_BASED_OUTPATIENT_CLINIC_OR_DEPARTMENT_OTHER): Payer: Medicare Other | Admitting: Physical Therapy

## 2021-10-16 ENCOUNTER — Encounter (HOSPITAL_COMMUNITY): Payer: Self-pay | Admitting: Hematology

## 2021-10-20 ENCOUNTER — Ambulatory Visit (HOSPITAL_BASED_OUTPATIENT_CLINIC_OR_DEPARTMENT_OTHER): Payer: Medicare Other | Admitting: Physical Therapy

## 2021-10-23 LAB — SURGICAL PATHOLOGY

## 2021-10-27 ENCOUNTER — Encounter (HOSPITAL_BASED_OUTPATIENT_CLINIC_OR_DEPARTMENT_OTHER): Payer: Self-pay | Admitting: Physical Therapy

## 2021-10-27 ENCOUNTER — Ambulatory Visit (HOSPITAL_BASED_OUTPATIENT_CLINIC_OR_DEPARTMENT_OTHER): Payer: Medicare Other | Attending: Sports Medicine | Admitting: Physical Therapy

## 2021-10-27 DIAGNOSIS — M62830 Muscle spasm of back: Secondary | ICD-10-CM

## 2021-10-27 DIAGNOSIS — M25552 Pain in left hip: Secondary | ICD-10-CM | POA: Diagnosis present

## 2021-10-27 DIAGNOSIS — R2689 Other abnormalities of gait and mobility: Secondary | ICD-10-CM

## 2021-10-27 NOTE — Therapy (Addendum)
OUTPATIENT PHYSICAL THERAPY TREATMENT NOTE/discharge    Patient Name: Angelica James MRN: [id number] DOB:Oct 20, 1945, 76 y.o., female Today's Date: 10/27/2021  PCP: Merryl Hacker, No REFERRING PROVIDER: Verner Chol, MD   PT End of Session - 10/27/21 1308     Visit Number 5    Number of Visits 19    Date for PT Re-Evaluation 11/17/21    Authorization Type [employer] [place]    PT Start Time 6803    PT Stop Time 2122    PT Time Calculation (min) 38 min    Activity Tolerance Patient tolerated treatment well    Behavior During Therapy WFL for tasks assessed/performed             Past Medical History:  Diagnosis Date   Chronic kidney disease    Past Surgical History:  Procedure Laterality Date   ABDOMINAL HYSTERECTOMY     BACK SURGERY     HERNIA REPAIR     KNEE SURGERY     Patient Active Problem List   Diagnosis Date Noted   Abscess of left genital labia 02/06/2021   Trochanteric bursitis of both hips 01/05/2021   Elevated BP without diagnosis of hypertension 10/05/2020   Breast pain, left 07/01/2020   Sciatica, left side 07/01/2020   Acute pain of left shoulder 01/07/2020   Carpal tunnel syndrome on left 12/14/2019   Stage 3a chronic kidney disease ([place]) 12/14/2019   Posterior vitreous detachment of right eye 09/14/2019   Monocytosis 11/17/2018   Prediabetes 11/17/2018   Persistent depressive disorder with anxious distress, currently mild 05/05/2018   Trigeminal neuralgia of right side of face 05/05/2018   Abnormality of gait 03/10/2018   Enthesopathy 03/10/2018   Family history of diabetes mellitus 03/10/2018   Hip pain, right 03/10/2018   Non-seasonal allergic rhinitis due to pollen 03/10/2018   Other bursitis disorders 03/10/2018   Other dyspnea and respiratory abnormality 03/10/2018   Primary localized osteoarthrosis, lower leg 03/10/2018   Spinal stenosis of lumbar region without neurogenic claudication 03/10/2018   Symptomatic  menopausal or female climacteric states 03/10/2018   Transient arterial retinal occlusion 03/10/2018   S/P chole 02/05/2018   Vasomotor symptoms due to menopause 11/25/2017   Left leg weakness 06/12/2017   Influenza B 04/24/2017   Malaise and fatigue 04/24/2017   Other specified anxiety disorders 11/08/2016   Calculus of gallbladder without cholecystitis without obstruction 07/10/2016   Nausea in adult 07/10/2016   Increased frequency of urination 07/04/2016   Incisional hernia, without obstruction or gangrene 10/10/2015   Diverticulitis of sigmoid colon 08/17/2014   Dorsalgia, unspecified 06/28/2014   Wrist pain, left 02/23/2014   Muscle spasms of neck 11/26/2013   Chronic kidney disease, stage II (mild) 07/10/2013   Hyperkalemia 07/10/2013   Obesity (BMI 30-39.9) 09/30/2012   Insomnia 09/29/2012   Post-nasal drip 09/29/2012   Gastro-esophageal reflux disease without esophagitis 06/30/2012   Dermatitis, seborrheic 02/27/2012   Sprain of unspecified site of sacroiliac region 08/10/2011   Bilateral bunions 06/25/2011   DJD of shoulder 04/11/2011   Fibromyalgia 08/02/2010   Hypercholesteremia 04/15/2009   Positive D-dimer 04/15/2009   History of gastroesophageal reflux (GERD) 04/14/2009    REFERRING DIAG: Low Back Pain/ Non-invasive decompression of the spine in October of 2022.  THERAPY DIAG:  No diagnosis found.  PERTINENT HISTORY: Chronic kidney disease  Anxiety ;  Left leg weakness, Trigeminal Neuralgia; Left shoulder DDD; cn have pain on the right as well; fibromylagia   PRECAUTIONS: none  SUBJECTIVE: Pt reports  no new changes.  She states that she does the stairs frequently in her home.  The back pain "comes and goes".   Granddaughter verifies no new changes.   PAIN:  Are you having pain? Yes NPRS scale: 2-3/10  Pain location: center of lower back Pain orientation:  PAIN TYPE: aching Pain description: constant  Aggravating factors: Patient has increased pain  when she is standing for too long  Relieving factors: sitting down    PRECAUTIONS: None   WEIGHT BEARING RESTRICTIONS No   FALLS:  Has patient fallen in last 6 months? No, Number of falls:    LIVING ENVIRONMENT: The patient has an upstairs to go into to get into and out of pain    OCCUPATION: retired    PLOF: Independent   Recreation: walking    PATIENT GOALS    Help the pain/ Keep from having surgery        OBJECTIVE:    DIAGNOSTIC FINDINGS:  Lumbar MRI: L5-S1 moderate to severe left lateral reccess stenois with S1 nerve impingement   Diffusely abnormal arrow signal throughout the visible spine and pelvis   PATIENT SURVEYS:  FOTO not given 2nd to cognition    SCREENING FOR RED FLAGS: Bowel or bladder incontinence: No Spinal tumors: No Cauda equina syndrome: No Compression fracture: No Abdominal aneurysm: No   COGNITION:          Overall cognitive status: Difficulty to assess Could not recall where she lives but also reports she isnt from this area. Had to ask questions a few times. Speaks very quietly.                               SENSATION:          Light touch: Appears intact          Stereognosis: Appears intact          Hot/Cold: Appears intact          Proprioception: Appears intact Denies paraesthesias  MUSCLE LENGTH:   POSTURE:      PALPATION: Significant spasming and tenderness to palpation in the left hip and lower back   LUMBARAROM/PROM   A/PROM A/PROM  08/14/2021 AROM 09/22/21  Flexion 70 with minor pain  85  Extension Increased pain just past neutral  10  Right lateral flexion Painful but difficult to tell if one way was more painful then the other 50%  Left lateral flexion Painful but difficult to tell if one way was more painful then the other  50%  Right rotation     Left rotation      (Blank rows = not tested)   LE AROM/PROM:   PROM Right 08/14/2021 Left 08/14/2021 Right 09/22/21 Left 09/22/21  Hip flexion 80 degrees before she  felt pain  90 with significant pain. Therapy asked patient to make sure to let the therapist know when she was in pain.  100 110  Hip extension        Hip abduction        Hip adduction        Hip internal rotation        Hip external rotation        Knee flexion        Knee extension        Ankle dorsiflexion        Ankle plantarflexion        Ankle inversion  Ankle eversion         (Blank rows = not tested)   LE MMT:   MMT Right 08/14/2021 Left 08/14/2021 Right 09/22/21 Left 09/22/21  Hip flexion 3/5 3/5 3+ 3+  Hip extension        Hip abduction 4/5 3+/5 4+ 4  Hip adduction        Hip internal rotation        Hip external rotation        Knee flexion        Knee extension 4+/5 4+/5   5 5   Ankle dorsiflexion        Ankle plantarflexion        Ankle inversion        Ankle eversion          GAIT: Very slow steps, lateral movement away from the left side; decreased single leg stance on the left    TODAY'S TREATMENT   Pt seen for aquatic therapy today.  Treatment took place in water 3.25-4.8 ft in depth at the [facility] pool. Temp of water was 91.  Pt entered/exited the pool via stairs (step through pattern) independently with bilat rail. Holding onto wall:   Heel/toe raises x 10    Hip abdct x 10 each; hip ext x 10 each;  Hip flex to/from ext with one hand on wall, one on yellow buoy    Squats x 12      Walking 3 laps in 4 ft deep water - forward, backward, sidestepping, and high knee marching forward with UE on yellow noodle and CGA-SBA for confidence and safety. Seated on bench: - sit to/from stand with side step R/L (traversing the entire bench)  Forward step up (on blue step in water) with unilateral UE support x 5 each LE, at 3 ft 6" depth Standing- tandem balance without UE support x 15s each LE Seated balance on yellow pool noodle between legs with HHA for confidence, attempting to bring feet off floor; trial with added yellow hand buoys and CGA  (improving comfort with increased time)  Pt requires buoyancy for support and to offload joints with strengthening exercises. Viscosity of the water is needed for resistance of strengthening; water current perturbations provides challenge to standing balance unsupported, requiring increased core activation.       PATIENT EDUCATION:  Education details: symptom management; importance of HEP compliance Person educated: Patient Education method: Explanation, Demonstration, Tactile cues, Verbal cues, and Handouts Education comprehension: needs further education     HOME EXERCISE PROGRAM: [id number]    ASSESSMENT:   CLINICAL IMPRESSION: Pt reporting reduction to elimination of low back pain and hip pain while in the water exercising.  She continues to require CGA/SBA from therapist for safety in water and improved confidence. As session progressed she needed less CGA.  Occasional cues to keep track of repetitions.  She will continue to benefit from aquatic and other skilled physical therapy with focus to improve strength, mobility and balance improving quality of life and safe completion of ADLs/functional mobility.      OBJECTIVE IMPAIRMENTS Abnormal gait, decreased activity tolerance, decreased endurance, decreased mobility, difficulty walking, decreased ROM, decreased strength, increased fascial restrictions, increased muscle spasms, and pain.    ACTIVITY LIMITATIONS cleaning, meal prep, and shopping.    PERSONAL FACTORS Chronic kidney disease ;Anxiety ;  Left leg weakness, Trigeminal Neuralgia; Left shoulder DDD; cn have pain on the right as well; fibromylagia  are also affecting patient's functional outcome.  REHAB POTENTIAL: Good   CLINICAL DECISION MAKING: Evolving/moderate complexity progressive decrease in function 2nd to pain    EVALUATION COMPLEXITY: Moderate     GOALS: Goals reviewed with patient? Yes   SHORT TERM GOALS:   STG Name Target Date Goal status  1 Patient  will increase passive bilateral hip flexion to 90 degrees  Baseline:  09/04/2021  Achieved 09/22/21  2 Patient will increase gross bilateral LE strength by 1 grade  Baseline:  09/04/2021  Ongoing  09/22/21  3 Patient will be independent with a basic HEP  Baseline: 09/04/2021  Ongoing 09/22/21  LONG TERM GOALS:    LTG Name Target Date Goal status  1 Patient will stand for 15 minutes without pain  Baseline: 11/17/2021 INITIAL  2 Patient will walk 3000' without pain in order to walk without difficulty  Baseline: 11/17/2021 INITIAL  3 Patient will demonstrate full lumbar ROM bilateral in order to perform ADL's  Baseline: 11/17/2021 INITIAL  PLAN: PT FREQUENCY: 1-2x/week   PT DURATION: 8 weeks   PLANNED INTERVENTIONS: Therapeutic exercises, Therapeutic activity, Neuro Muscular re-education, Gait training, Patient/Family education, Joint mobilization, Dry Needling, Electrical stimulation, Cryotherapy, Moist heat, Ultrasound, and Manual therapy   PLAN FOR NEXT SESSION:  Continue aquatic therapy.  On land consider grade 1 or II LAD to improve left hip flexion; trigger point release to the left hip; gluteal strengthening as tolerated. Work on more normal gait in the pool. Per MRI patient may have S1 nerve root compression so follow symptoms when advancing or scaling back her exercises.   PHYSICAL THERAPY DISCHARGE SUMMARY  Visits from Start of Care: 5  Current functional level related to goals / functional outcomes: Unknown the patient did not return   Remaining deficits: Unknown   Education / Equipment: HEP    Patient agrees to discharge. Patient goals were not met. Patient is being discharged due to not returning since the last visit.   Kerin Perna, PTA 10/27/21 4:36 PM  Carolyne Littles PT DPT  04/09/2022

## 2021-11-23 ENCOUNTER — Telehealth: Payer: Self-pay | Admitting: Hematology

## 2021-11-23 NOTE — Telephone Encounter (Signed)
Called patient regarding upcoming July appointment, voicemail was left. 

## 2022-01-15 ENCOUNTER — Other Ambulatory Visit: Payer: Self-pay

## 2022-01-15 ENCOUNTER — Inpatient Hospital Stay: Payer: Medicare Other | Attending: Hematology

## 2022-01-15 ENCOUNTER — Ambulatory Visit (HOSPITAL_COMMUNITY): Admission: RE | Admit: 2022-01-15 | Payer: Medicare Other | Source: Ambulatory Visit

## 2022-01-15 DIAGNOSIS — C9310 Chronic myelomonocytic leukemia not having achieved remission: Secondary | ICD-10-CM | POA: Insufficient documentation

## 2022-01-15 LAB — CMP (CANCER CENTER ONLY)
ALT: 15 U/L (ref 0–44)
AST: 18 U/L (ref 15–41)
Albumin: 4.8 g/dL (ref 3.5–5.0)
Alkaline Phosphatase: 59 U/L (ref 38–126)
Anion gap: 6 (ref 5–15)
BUN: 17 mg/dL (ref 8–23)
CO2: 31 mmol/L (ref 22–32)
Calcium: 9.6 mg/dL (ref 8.9–10.3)
Chloride: 104 mmol/L (ref 98–111)
Creatinine: 0.95 mg/dL (ref 0.44–1.00)
GFR, Estimated: >60 mL/min (ref >60)
Glucose, Bld: 118 mg/dL — ABNORMAL HIGH (ref 70–99)
Potassium: 3.8 mmol/L (ref 3.5–5.1)
Sodium: 141 mmol/L (ref 135–145)
Total Bilirubin: 0.8 mg/dL (ref 0.3–1.2)
Total Protein: 8.4 g/dL — ABNORMAL HIGH (ref 6.5–8.1)

## 2022-01-15 LAB — CBC WITH DIFFERENTIAL/PLATELET
Abs Immature Granulocytes: 0.12 10*3/uL — ABNORMAL HIGH (ref 0.00–0.07)
Basophils Absolute: 0.1 10*3/uL (ref 0.0–0.1)
Basophils Relative: 0 %
Eosinophils Absolute: 0.2 10*3/uL (ref 0.0–0.5)
Eosinophils Relative: 1 %
HCT: 44.1 % (ref 36.0–46.0)
Hemoglobin: 14.3 g/dL (ref 12.0–15.0)
Immature Granulocytes: 1 %
Lymphocytes Relative: 9 %
Lymphs Abs: 1.4 10*3/uL (ref 0.7–4.0)
MCH: 30 pg (ref 26.0–34.0)
MCHC: 32.4 g/dL (ref 30.0–36.0)
MCV: 92.5 fL (ref 80.0–100.0)
Monocytes Absolute: 1.9 10*3/uL — ABNORMAL HIGH (ref 0.1–1.0)
Monocytes Relative: 12 %
Neutro Abs: 12 10*3/uL — ABNORMAL HIGH (ref 1.7–7.7)
Neutrophils Relative %: 77 %
Platelets: 420 10*3/uL — ABNORMAL HIGH (ref 150–400)
RBC: 4.77 MIL/uL (ref 3.87–5.11)
RDW: 14.1 % (ref 11.5–15.5)
WBC: 15.7 10*3/uL — ABNORMAL HIGH (ref 4.0–10.5)
nRBC: 0 % (ref 0.0–0.2)

## 2022-01-15 LAB — LACTATE DEHYDROGENASE: LDH: 261 U/L — ABNORMAL HIGH (ref 98–192)

## 2022-01-29 ENCOUNTER — Inpatient Hospital Stay: Payer: Medicare Other | Attending: Hematology | Admitting: Hematology

## 2022-01-29 ENCOUNTER — Other Ambulatory Visit: Payer: Self-pay

## 2022-01-29 VITALS — BP 132/68 | HR 65 | Temp 97.0°F | Resp 18 | Wt 193.3 lb

## 2022-01-29 DIAGNOSIS — Z880 Allergy status to penicillin: Secondary | ICD-10-CM | POA: Insufficient documentation

## 2022-01-29 DIAGNOSIS — Z800 Family history of malignant neoplasm of digestive organs: Secondary | ICD-10-CM | POA: Diagnosis not present

## 2022-01-29 DIAGNOSIS — Z79899 Other long term (current) drug therapy: Secondary | ICD-10-CM | POA: Insufficient documentation

## 2022-01-29 DIAGNOSIS — R6883 Chills (without fever): Secondary | ICD-10-CM | POA: Insufficient documentation

## 2022-01-29 DIAGNOSIS — Z811 Family history of alcohol abuse and dependence: Secondary | ICD-10-CM | POA: Diagnosis not present

## 2022-01-29 DIAGNOSIS — D72829 Elevated white blood cell count, unspecified: Secondary | ICD-10-CM | POA: Diagnosis not present

## 2022-01-29 DIAGNOSIS — D751 Secondary polycythemia: Secondary | ICD-10-CM | POA: Diagnosis not present

## 2022-01-29 DIAGNOSIS — Z87891 Personal history of nicotine dependence: Secondary | ICD-10-CM | POA: Insufficient documentation

## 2022-01-29 DIAGNOSIS — C9310 Chronic myelomonocytic leukemia not having achieved remission: Secondary | ICD-10-CM | POA: Diagnosis present

## 2022-01-29 DIAGNOSIS — Z818 Family history of other mental and behavioral disorders: Secondary | ICD-10-CM | POA: Insufficient documentation

## 2022-01-29 DIAGNOSIS — Z888 Allergy status to other drugs, medicaments and biological substances status: Secondary | ICD-10-CM | POA: Diagnosis not present

## 2022-01-29 DIAGNOSIS — Z803 Family history of malignant neoplasm of breast: Secondary | ICD-10-CM | POA: Insufficient documentation
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
## 2022-01-31 ENCOUNTER — Telehealth: Payer: Self-pay | Admitting: Hematology

## 2022-01-31 NOTE — Telephone Encounter (Signed)
Left message with follow-up appointment per 8/14 los.

## 2022-02-05 ENCOUNTER — Ambulatory Visit (HOSPITAL_COMMUNITY)
Admission: RE | Admit: 2022-02-05 | Discharge: 2022-02-05 | Disposition: A | Discharge status: Home / Self Care | Payer: Medicare Other | Source: Ambulatory Visit | Attending: Hematology | Admitting: Hematology

## 2022-02-05 DIAGNOSIS — C9310 Chronic myelomonocytic leukemia not having achieved remission: Secondary | ICD-10-CM | POA: Insufficient documentation

## 2022-05-23 ENCOUNTER — Telehealth: Payer: Self-pay | Admitting: Hematology
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
## 2022-05-25 ENCOUNTER — Other Ambulatory Visit: Payer: Medicare Other

## 2022-05-25 ENCOUNTER — Ambulatory Visit: Payer: Medicare Other | Admitting: Hematology

## 2022-05-28 ENCOUNTER — Other Ambulatory Visit: Payer: Self-pay

## 2022-05-28 DIAGNOSIS — C9310 Chronic myelomonocytic leukemia not having achieved remission: Secondary | ICD-10-CM

## 2022-05-29 ENCOUNTER — Inpatient Hospital Stay (HOSPITAL_BASED_OUTPATIENT_CLINIC_OR_DEPARTMENT_OTHER): Payer: Medicare Other | Admitting: Hematology

## 2022-05-29 ENCOUNTER — Inpatient Hospital Stay: Payer: Medicare Other | Attending: Hematology

## 2022-05-29 VITALS — BP 132/71 | HR 76 | Temp 97.9°F | Resp 18 | Ht 68.0 in | Wt 191.0 lb

## 2022-05-29 DIAGNOSIS — R739 Hyperglycemia, unspecified: Secondary | ICD-10-CM | POA: Insufficient documentation

## 2022-05-29 DIAGNOSIS — Z87891 Personal history of nicotine dependence: Secondary | ICD-10-CM | POA: Insufficient documentation

## 2022-05-29 DIAGNOSIS — R6883 Chills (without fever): Secondary | ICD-10-CM | POA: Diagnosis not present

## 2022-05-29 DIAGNOSIS — R519 Headache, unspecified: Secondary | ICD-10-CM | POA: Diagnosis not present

## 2022-05-29 DIAGNOSIS — N1831 Chronic kidney disease, stage 3a: Secondary | ICD-10-CM | POA: Diagnosis not present

## 2022-05-29 DIAGNOSIS — R270 Ataxia, unspecified: Secondary | ICD-10-CM

## 2022-05-29 DIAGNOSIS — R2689 Other abnormalities of gait and mobility: Secondary | ICD-10-CM

## 2022-05-29 DIAGNOSIS — R630 Anorexia: Secondary | ICD-10-CM | POA: Insufficient documentation

## 2022-05-29 DIAGNOSIS — C9310 Chronic myelomonocytic leukemia not having achieved remission: Secondary | ICD-10-CM

## 2022-05-29 DIAGNOSIS — Z800 Family history of malignant neoplasm of digestive organs: Secondary | ICD-10-CM | POA: Insufficient documentation

## 2022-05-29 DIAGNOSIS — Z79899 Other long term (current) drug therapy: Secondary | ICD-10-CM | POA: Insufficient documentation

## 2022-05-29 DIAGNOSIS — R61 Generalized hyperhidrosis: Secondary | ICD-10-CM | POA: Diagnosis not present

## 2022-05-29 DIAGNOSIS — D72829 Elevated white blood cell count, unspecified: Secondary | ICD-10-CM | POA: Diagnosis not present

## 2022-05-29 DIAGNOSIS — Z880 Allergy status to penicillin: Secondary | ICD-10-CM | POA: Diagnosis not present

## 2022-05-29 DIAGNOSIS — Z803 Family history of malignant neoplasm of breast: Secondary | ICD-10-CM | POA: Insufficient documentation

## 2022-05-29 DIAGNOSIS — Z888 Allergy status to other drugs, medicaments and biological substances status: Secondary | ICD-10-CM | POA: Diagnosis not present

## 2022-05-29 LAB — CBC WITH DIFFERENTIAL (CANCER CENTER ONLY)
Abs Immature Granulocytes: 0.11 10*3/uL — ABNORMAL HIGH (ref 0.00–0.07)
Basophils Absolute: 0.1 10*3/uL (ref 0.0–0.1)
Basophils Relative: 1 %
Eosinophils Absolute: 0.2 10*3/uL (ref 0.0–0.5)
Eosinophils Relative: 2 %
HCT: 43.5 % (ref 36.0–46.0)
Hemoglobin: 14.3 g/dL (ref 12.0–15.0)
Immature Granulocytes: 1 %
Lymphocytes Relative: 12 %
Lymphs Abs: 1.7 10*3/uL (ref 0.7–4.0)
MCH: 30.8 pg (ref 26.0–34.0)
MCHC: 32.9 g/dL (ref 30.0–36.0)
MCV: 93.5 fL (ref 80.0–100.0)
Monocytes Absolute: 1.5 10*3/uL — ABNORMAL HIGH (ref 0.1–1.0)
Monocytes Relative: 10 %
Neutro Abs: 10.6 10*3/uL — ABNORMAL HIGH (ref 1.7–7.7)
Neutrophils Relative %: 74 %
Platelet Count: 424 10*3/uL — ABNORMAL HIGH (ref 150–400)
RBC: 4.65 MIL/uL (ref 3.87–5.11)
RDW: 14.7 % (ref 11.5–15.5)
WBC Count: 14.2 10*3/uL — ABNORMAL HIGH (ref 4.0–10.5)
nRBC: 0 % (ref 0.0–0.2)

## 2022-05-29 LAB — CMP (CANCER CENTER ONLY)
ALT: 21 U/L (ref 0–44)
AST: 23 U/L (ref 15–41)
Albumin: 4.7 g/dL (ref 3.5–5.0)
Alkaline Phosphatase: 63 U/L (ref 38–126)
Anion gap: 6 (ref 5–15)
BUN: 13 mg/dL (ref 8–23)
CO2: 30 mmol/L (ref 22–32)
Calcium: 9.9 mg/dL (ref 8.9–10.3)
Chloride: 106 mmol/L (ref 98–111)
Creatinine: 0.94 mg/dL (ref 0.44–1.00)
GFR, Estimated: >60 mL/min (ref >60)
Glucose, Bld: 92 mg/dL (ref 70–99)
Potassium: 3.9 mmol/L (ref 3.5–5.1)
Sodium: 142 mmol/L (ref 135–145)
Total Bilirubin: 0.7 mg/dL (ref 0.3–1.2)
Total Protein: 7.7 g/dL (ref 6.5–8.1)

## 2022-05-29 LAB — LACTATE DEHYDROGENASE: LDH: 301 U/L — ABNORMAL HIGH (ref 98–192)
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
## 2022-05-30 ENCOUNTER — Ambulatory Visit (HOSPITAL_COMMUNITY)
Admission: RE | Admit: 2022-05-30 | Discharge: 2022-05-30 | Disposition: A | Discharge status: Home / Self Care | Payer: Medicare Other | Source: Ambulatory Visit | Attending: Hematology | Admitting: Hematology

## 2022-05-30 DIAGNOSIS — R2689 Other abnormalities of gait and mobility: Secondary | ICD-10-CM | POA: Diagnosis present

## 2022-05-30 DIAGNOSIS — R519 Headache, unspecified: Secondary | ICD-10-CM | POA: Diagnosis not present

## 2022-05-30 DIAGNOSIS — R270 Ataxia, unspecified: Secondary | ICD-10-CM | POA: Insufficient documentation

## 2022-06-01 ENCOUNTER — Ambulatory Visit: Payer: Medicare Other | Admitting: Hematology

## 2022-06-01 ENCOUNTER — Other Ambulatory Visit: Payer: Medicare Other

## 2022-06-14 ENCOUNTER — Inpatient Hospital Stay: Payer: Medicare Other | Admitting: Hematology

## 2022-10-01 ENCOUNTER — Telehealth: Payer: Self-pay | Admitting: Hematology

## 2022-10-01 NOTE — Telephone Encounter (Signed)
Patient called to reschedule appointments.

## 2022-10-15 ENCOUNTER — Other Ambulatory Visit: Payer: Medicare Other

## 2022-10-15 ENCOUNTER — Ambulatory Visit: Payer: Medicare Other | Admitting: Hematology

## 2022-11-16 ENCOUNTER — Other Ambulatory Visit: Payer: Self-pay

## 2022-11-16 DIAGNOSIS — C9310 Chronic myelomonocytic leukemia not having achieved remission: Secondary | ICD-10-CM

## 2022-11-19 ENCOUNTER — Inpatient Hospital Stay: Payer: Medicare Other | Attending: Hematology

## 2022-11-19 ENCOUNTER — Other Ambulatory Visit: Payer: Self-pay

## 2022-11-19 ENCOUNTER — Inpatient Hospital Stay (HOSPITAL_BASED_OUTPATIENT_CLINIC_OR_DEPARTMENT_OTHER): Payer: Medicare Other | Admitting: Hematology

## 2022-11-19 VITALS — BP 152/71 | HR 67 | Temp 97.5°F | Resp 18 | Wt 189.2 lb

## 2022-11-19 DIAGNOSIS — M542 Cervicalgia: Secondary | ICD-10-CM | POA: Diagnosis not present

## 2022-11-19 DIAGNOSIS — D751 Secondary polycythemia: Secondary | ICD-10-CM | POA: Diagnosis not present

## 2022-11-19 DIAGNOSIS — C9310 Chronic myelomonocytic leukemia not having achieved remission: Secondary | ICD-10-CM | POA: Insufficient documentation

## 2022-11-19 DIAGNOSIS — M545 Low back pain, unspecified: Secondary | ICD-10-CM | POA: Diagnosis not present

## 2022-11-19 DIAGNOSIS — R270 Ataxia, unspecified: Secondary | ICD-10-CM

## 2022-11-19 DIAGNOSIS — Z880 Allergy status to penicillin: Secondary | ICD-10-CM | POA: Insufficient documentation

## 2022-11-19 DIAGNOSIS — Z90710 Acquired absence of both cervix and uterus: Secondary | ICD-10-CM | POA: Insufficient documentation

## 2022-11-19 DIAGNOSIS — Z888 Allergy status to other drugs, medicaments and biological substances status: Secondary | ICD-10-CM | POA: Diagnosis not present

## 2022-11-19 DIAGNOSIS — R296 Repeated falls: Secondary | ICD-10-CM | POA: Diagnosis not present

## 2022-11-19 DIAGNOSIS — Z79899 Other long term (current) drug therapy: Secondary | ICD-10-CM | POA: Diagnosis not present

## 2022-11-19 DIAGNOSIS — Z5941 Food insecurity: Secondary | ICD-10-CM | POA: Insufficient documentation

## 2022-11-19 DIAGNOSIS — Z809 Family history of malignant neoplasm, unspecified: Secondary | ICD-10-CM | POA: Diagnosis not present

## 2022-11-19 DIAGNOSIS — R42 Dizziness and giddiness: Secondary | ICD-10-CM | POA: Diagnosis not present

## 2022-11-19 DIAGNOSIS — Z87891 Personal history of nicotine dependence: Secondary | ICD-10-CM | POA: Diagnosis not present

## 2022-11-19 DIAGNOSIS — R6883 Chills (without fever): Secondary | ICD-10-CM | POA: Insufficient documentation

## 2022-11-19 DIAGNOSIS — Z800 Family history of malignant neoplasm of digestive organs: Secondary | ICD-10-CM | POA: Insufficient documentation

## 2022-11-19 DIAGNOSIS — Z803 Family history of malignant neoplasm of breast: Secondary | ICD-10-CM | POA: Diagnosis not present

## 2022-11-19 DIAGNOSIS — R2689 Other abnormalities of gait and mobility: Secondary | ICD-10-CM

## 2022-11-19 DIAGNOSIS — R200 Anesthesia of skin: Secondary | ICD-10-CM | POA: Diagnosis not present

## 2022-11-19 LAB — CBC WITH DIFFERENTIAL (CANCER CENTER ONLY)
Abs Immature Granulocytes: 0.18 10*3/uL — ABNORMAL HIGH (ref 0.00–0.07)
Basophils Absolute: 0.1 10*3/uL (ref 0.0–0.1)
Basophils Relative: 0 %
Eosinophils Absolute: 0.3 10*3/uL (ref 0.0–0.5)
Eosinophils Relative: 2 %
HCT: 42.6 % (ref 36.0–46.0)
Hemoglobin: 13.8 g/dL (ref 12.0–15.0)
Immature Granulocytes: 1 %
Lymphocytes Relative: 12 %
Lymphs Abs: 1.9 10*3/uL (ref 0.7–4.0)
MCH: 30 pg (ref 26.0–34.0)
MCHC: 32.4 g/dL (ref 30.0–36.0)
MCV: 92.6 fL (ref 80.0–100.0)
Monocytes Absolute: 1.8 10*3/uL — ABNORMAL HIGH (ref 0.1–1.0)
Monocytes Relative: 11 %
Neutro Abs: 12.3 10*3/uL — ABNORMAL HIGH (ref 1.7–7.7)
Neutrophils Relative %: 74 %
Platelet Count: 342 10*3/uL (ref 150–400)
RBC: 4.6 MIL/uL (ref 3.87–5.11)
RDW: 14.1 % (ref 11.5–15.5)
WBC Count: 16.5 10*3/uL — ABNORMAL HIGH (ref 4.0–10.5)
nRBC: 0 % (ref 0.0–0.2)

## 2022-11-19 LAB — CMP (CANCER CENTER ONLY)
ALT: 24 U/L (ref 0–44)
AST: 32 U/L (ref 15–41)
Albumin: 4.5 g/dL (ref 3.5–5.0)
Alkaline Phosphatase: 72 U/L (ref 38–126)
Anion gap: 5 (ref 5–15)
BUN: 15 mg/dL (ref 8–23)
CO2: 31 mmol/L (ref 22–32)
Calcium: 9.7 mg/dL (ref 8.9–10.3)
Chloride: 105 mmol/L (ref 98–111)
Creatinine: 0.91 mg/dL (ref 0.44–1.00)
GFR, Estimated: >60 mL/min (ref >60)
Glucose, Bld: 98 mg/dL (ref 70–99)
Potassium: 3.9 mmol/L (ref 3.5–5.1)
Sodium: 141 mmol/L (ref 135–145)
Total Bilirubin: 1 mg/dL (ref 0.3–1.2)
Total Protein: 7.9 g/dL (ref 6.5–8.1)

## 2022-11-19 LAB — LACTATE DEHYDROGENASE: LDH: 300 U/L — ABNORMAL HIGH (ref 98–192)
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
## 2022-11-21 ENCOUNTER — Telehealth: Payer: Self-pay | Admitting: Hematology

## 2022-11-27 ENCOUNTER — Telehealth: Payer: Self-pay | Admitting: *Deleted

## 2022-11-27 ENCOUNTER — Inpatient Hospital Stay: Payer: Medicare Other | Admitting: Licensed Clinical Social Worker

## 2022-11-27 ENCOUNTER — Other Ambulatory Visit: Payer: Self-pay

## 2022-11-27 ENCOUNTER — Inpatient Hospital Stay (HOSPITAL_BASED_OUTPATIENT_CLINIC_OR_DEPARTMENT_OTHER): Payer: Medicare Other | Admitting: Internal Medicine

## 2022-11-27 ENCOUNTER — Other Ambulatory Visit: Payer: Self-pay | Admitting: *Deleted

## 2022-11-27 VITALS — BP 139/61 | HR 80 | Temp 98.0°F | Resp 15 | Ht 67.5 in | Wt 185.3 lb

## 2022-11-27 DIAGNOSIS — R270 Ataxia, unspecified: Secondary | ICD-10-CM

## 2022-11-27 DIAGNOSIS — R2689 Other abnormalities of gait and mobility: Secondary | ICD-10-CM

## 2022-11-27 DIAGNOSIS — R296 Repeated falls: Secondary | ICD-10-CM

## 2022-11-27 DIAGNOSIS — C9310 Chronic myelomonocytic leukemia not having achieved remission: Secondary | ICD-10-CM | POA: Diagnosis not present

## 2022-11-27 NOTE — Progress Notes (Signed)
[facility] Clinical Social Work  Initial Assessment   Angelica James is a 77 y.o. year old female contacted by phone. Clinical Social Work was referred by medical provider for assessment of psychosocial needs.   SDOH (Social Determinants of Health) assessments performed: Yes SDOH Interventions    Flowsheet Row Office Visit from 11/27/2022 in [facility]  SDOH Interventions   Housing Interventions Intervention Not Indicated  Transportation Interventions Intervention Not Indicated  Utilities Interventions Intervention Not Indicated       SDOH Screenings   Food Insecurity: Food Insecurity Present (11/27/2022)  Housing: Low Risk  (11/27/2022)  Transportation Needs: No Transportation Needs (11/27/2022)  Utilities: Not At Risk (11/27/2022)  Depression (PHQ2-9): Low Risk  (11/27/2022)  Tobacco Use: Medium Risk (10/27/2021)     Distress Screen completed: No     No data to display            Family/Social Information:  Housing Arrangement: patient lives alone but has predominantly been staying with her daughter in [place] since the issues with balance became worse. Family members/support persons in your life? Pt states her sister resides close by her home in [place] and checks in on her everyday when she is there, but primarily pt's daughter is providing support at this time as pt is not able to stay alone at home.   Transportation concerns: no  Employment: Retired .  Income source: Actor concerns: Yes, current concerns Type of concern: Utilities Food access concerns: no, pt states when she is home she is not able to go shopping alone due to balance issues but her sister would take her when she was able.   Religious or spiritual practice: Not known Services Currently in place:  none  Coping/ Adjustment to diagnosis: Patient understands treatment plan and what happens next? yes Concerns about diagnosis and/or  treatment: How will I care for myself and Quality of life Patient reported stressors: Depression, Adjusting to my illness, and Physical issues Hopes and/or priorities: pt's priority is to finish diagnostics with the hope of improvement in balance Patient enjoys time with family/ friends Current coping skills/ strengths: Motivation for treatment/growth  and Supportive family/friends     SUMMARY: Current SDOH Barriers:  Financial constraints related to fixed income and ADL IADL limitations  Clinical Social Work Clinical Goal(s):  Explore community resource options for unmet needs related to:  Corporate treasurer  and Food Insecurity   Interventions: Discussed common feeling and emotions when being diagnosed with cancer, and the importance of support during treatment Informed patient of the support team roles and support services at [facility] Provided CSW contact information and encouraged patient to call with any questions or concerns Provided patient with information about food pantry at the cancer center as well as local resources while pt is staying with her daughter in [place].  Per pt she is not able to drive herself when she is at home and is reliant on her sister to take her shopping which is what sometimes may make her food insecure.  CSW shared availability for counseling and emotional support as well as offer to assist with referring to outside counselor if desired.  Pt states she does not need counseling support at this time, but will reach out to CSW if that need should change.     Follow Up Plan: Patient will contact CSW with any support or resource needs Patient verbalizes understanding of plan: Yes    Rachel Moulds, LCSW Clinical  Social Worker [facility]

## 2022-11-27 NOTE — Telephone Encounter (Signed)
MD ordered PT referral for this patient - [facility] in [place], [place] accepted referral (patient lives in [place] per grandaughter, Phoebe Sharps, who accompanied her to appointment today.)  Patient demographic sheet, referral & today's office noted faxed to [phone].  Fax confirmation received.

## 2022-11-27 NOTE — Progress Notes (Signed)
[facility] at [facility] [address]  [place], [place] [address] [phone]   New Patient Evaluation  Date of Service: 11/27/22 Patient Name: Angelica James Patient MRN: [id number] Patient DOB: Aug 05, 1945 Provider: Henreitta Leber, MD  Identifying Statement:  Angelica James is a 77 y.o. female with Ataxia - Plan: MR CERVICAL SPINE W WO CONTRAST, Ambulatory referral to Home Health  Chronic myelomonocytic leukemia not having achieved remission (HCC) - Plan: MR CERVICAL SPINE W WO CONTRAST, Ambulatory referral to Home Health  Falls frequently - Plan: MR CERVICAL SPINE W WO CONTRAST, Ambulatory referral to Home Health who presents for initial consultation and evaluation regarding cancer associated neurologic deficits.    Referring Provider: Johney Maine, MD [address] [place],  [place] [address]  Primary Cancer: [facility]  Oncologic History: Oncology History   No history exists.    History of Present Illness: The patient's records from the referring physician were obtained and reviewed and the patient interviewed to confirm this HPI.  Angelica James presents to review recent issues with balance.  She describes frequent episodes of imbalance, feeling "pulled to the right".  She has had several falls.  Onset was ~6 months ago.  Also acknowledges neck pain and spasms, urinary urgency and incomplete emptying.  Denies frank weakness, numbness, slurred speech, seizures.    Medications: Current Outpatient Medications on File Prior to Visit  Medication Sig Dispense Refill   acetaminophen (TYLENOL) 650 MG CR tablet Take 1 tablet by mouth daily.     albuterol (VENTOLIN HFA) 108 (90 Base) MCG/ACT inhaler Inhale into the lungs.     aspirin 81 MG EC tablet Take by mouth.     cetirizine (ZYRTEC) 10 MG tablet Take by mouth.     Cholecalciferol 25 MCG (1000 UT) tablet Take by mouth.     cyclobenzaprine (FLEXERIL) 10 MG tablet Take by mouth.      Docusate Sodium (DSS) 100 MG CAPS Take by mouth.     fluticasone (FLONASE) 50 MCG/ACT nasal spray Place into the nose.     gabapentin (NEURONTIN) 100 MG capsule Take by mouth.     linaclotide (LINZESS) 290 MCG CAPS capsule Take by mouth.     omeprazole (PRILOSEC) 40 MG capsule Take by mouth.     No current facility-administered medications on file prior to visit.    Allergies:  Allergies  Allergen Reactions   Penicillins Hives   Meloxicam Nausea And Vomiting   Past Medical History:  Past Medical History:  Diagnosis Date   Chronic kidney disease    Past Surgical History:  Past Surgical History:  Procedure Laterality Date   ABDOMINAL HYSTERECTOMY     BACK SURGERY     HERNIA REPAIR     KNEE SURGERY     Social History:  Social History   Socioeconomic History   Marital status: Single    Spouse name: Not on file   Number of children: Not on file   Years of education: Not on file   Highest education level: Not on file  Occupational History   Not on file  Tobacco Use   Smoking status: Former    Types: Cigarettes   Smokeless tobacco: Never  Substance and Sexual Activity   Alcohol use: Not on file   Drug use: Not on file   Sexual activity: Not on file  Other Topics Concern   Not on file  Social History Narrative   Not on file   Social Determinants  of Health   Financial Resource Strain: Not on file  Food Insecurity: Food Insecurity Present (11/27/2022)   Hunger Vital Sign    Worried About Running Out of Food in the Last Year: Never true    Ran Out of Food in the Last Year: Sometimes true  Transportation Needs: No Transportation Needs (11/27/2022)   PRAPARE - Administrator, Civil Service (Medical): No    Lack of Transportation (Non-Medical): No  Physical Activity: Not on file  Stress: Not on file  Social Connections: Not on file  Intimate Partner Violence: Not At Risk (11/27/2022)   Humiliation, Afraid, Rape, and Kick questionnaire    Fear of Current  or Ex-Partner: No    Emotionally Abused: No    Physically Abused: No    Sexually Abused: No   Family History: No family history on file.  Review of Systems: Constitutional: Doesn't report fevers, chills or abnormal weight loss Eyes: Doesn't report blurriness of vision Ears, nose, mouth, throat, and face: Doesn't report sore throat Respiratory: Doesn't report cough, dyspnea or wheezes Cardiovascular: Doesn't report palpitation, chest discomfort  Gastrointestinal:  Doesn't report nausea, constipation, diarrhea GU: Doesn't report incontinence Skin: Doesn't report skin rashes Neurological: Per HPI Musculoskeletal: Doesn't report joint pain Behavioral/Psych: Doesn't report anxiety  Physical Exam: Vitals:   11/27/22 0907  BP: 139/61  Pulse: 80  Resp: 15  Temp: 98 F (36.7 C)  SpO2: 97%   KPS: 70. General: Alert, cooperative, pleasant, in no acute distress Head: Normal EENT: No conjunctival injection or scleral icterus.  Lungs: Resp effort normal Cardiac: Regular rate Abdomen: Non-distended abdomen Skin: No rashes cyanosis or petechiae. Extremities: No clubbing or edema  Neurologic Exam: Mental Status: Awake, alert, attentive to examiner. Oriented to self and environment. Language is fluent with intact comprehension.  Cranial Nerves: Visual acuity is grossly normal. Visual fields are full. Extra-ocular movements intact. No ptosis. Face is symmetric Motor: Tone and bulk are normal. Power is full in both arms and legs. Reflexes are increased in arms and at patella, no pathologic reflexes present.  Sensory: Intact to light touch Gait: Dystaxic   Labs: I have reviewed the data as listed    Component Value Date/Time   NA 141 11/19/2022 1117   K 3.9 11/19/2022 1117   CL 105 11/19/2022 1117   CO2 31 11/19/2022 1117   GLUCOSE 98 11/19/2022 1117   BUN 15 11/19/2022 1117   CREATININE 0.91 11/19/2022 1117   CALCIUM 9.7 11/19/2022 1117   PROT 7.9 11/19/2022 1117   ALBUMIN  4.5 11/19/2022 1117   AST 32 11/19/2022 1117   ALT 24 11/19/2022 1117   ALKPHOS 72 11/19/2022 1117   BILITOT 1.0 11/19/2022 1117   GFRNONAA >60 11/19/2022 1117   Lab Results  Component Value Date   WBC 16.5 (H) 11/19/2022   NEUTROABS 12.3 (H) 11/19/2022   HGB 13.8 11/19/2022   HCT 42.6 11/19/2022   MCV 92.6 11/19/2022   PLT 342 11/19/2022    Imaging: CLINICAL DATA:  Loss of balance, falling to the right, head trauma, headache, weakness   EXAM: CT HEAD WITHOUT CONTRAST   TECHNIQUE: Contiguous axial images were obtained from the base of the skull through the vertex without intravenous contrast.   RADIATION DOSE REDUCTION: This exam was performed according to the departmental dose-optimization program which includes automated exposure control, adjustment of the mA and/or kV according to patient size and/or use of iterative reconstruction technique.   COMPARISON:  None Available.   FINDINGS:  Brain: Confluent hypodensities are seen throughout the periventricular white matter, most consistent with chronic small vessel ischemic change. No signs of acute infarct or hemorrhage. The lateral ventricles and midline structures are unremarkable. No acute extra-axial fluid collections. No mass effect.   Vascular: No hyperdense vessel or unexpected calcification.   Skull: Normal. Negative for fracture or focal lesion.   Sinuses/Orbits: No acute finding.   Other: None.   IMPRESSION: 1. Bilateral hypodensities within the periventricular white matter, most consistent with chronic small vessel ischemic change. 2. No acute intracranial process.     Electronically Signed   By: Sharlet Salina M.D.   On: 05/30/2022 19:31   Assessment/Plan Ataxia - Plan: MR CERVICAL SPINE W WO CONTRAST, Ambulatory referral to Home Health  Chronic myelomonocytic leukemia not having achieved remission (HCC) - Plan: MR CERVICAL SPINE W WO CONTRAST, Ambulatory referral to Home Health  Falls  frequently - Plan: MR CERVICAL SPINE W WO CONTRAST, Ambulatory referral to [facility]  Angelica James presents with clinical syndrome which best localizes to the cervical spine.  This is based on exam findings, urinary urgency, neck symptoms.  Etiology is unclear, may be spondylitic or arthritic changes.  No strong suspicion for neoplastic process based on history.  Recommended MRI cervical spine with and w/o contrast.  She is agreeable with this.  Also discussed and recommended home PT evaluation for gait dystaxia.    We spent twenty additional minutes teaching regarding the natural history, biology, and historical experience in the treatment of neurologic complications of cancer.   We appreciate the opportunity to participate in the care of [facility].  We will follow up with her after the c-spine study.  All questions were answered. The patient knows to call the clinic with any problems, questions or concerns. No barriers to learning were detected.  The total time spent in the encounter was 40 minutes and more than 50% was on counseling and review of test results   Henreitta Leber, MD Medical Director of Neuro-Oncology [facility] [place] 11/27/22 2:55 PM

## 2022-11-30 ENCOUNTER — Telehealth: Payer: Self-pay | Admitting: *Deleted

## 2022-11-30 NOTE — Telephone Encounter (Signed)
Received call from [facility].  Patient was unavailable for start of Home Health Nursing and PT so they plan to start next week on December 05, 2022.

## 2022-12-06 ENCOUNTER — Telehealth: Payer: Self-pay | Admitting: *Deleted

## 2022-12-06 NOTE — Telephone Encounter (Signed)
Received PC from [place], PT with [facility], requesting orders for PT once a week for 8 weeks, verbal order given.

## 2022-12-10 ENCOUNTER — Ambulatory Visit (HOSPITAL_COMMUNITY): Admission: RE | Admit: 2022-12-10 | Payer: Medicare Other | Source: Ambulatory Visit

## 2022-12-11 ENCOUNTER — Telehealth: Payer: Self-pay | Admitting: *Deleted

## 2022-12-11 ENCOUNTER — Inpatient Hospital Stay: Payer: Medicare Other | Admitting: Internal Medicine

## 2022-12-11 NOTE — Telephone Encounter (Signed)
PC to patient, no answer, left VM - informed patient she missed a MRI appointment yesterday & her appointment with Dr Barbaraann Cao today.  Requested that she contact our office, [phone].

## 2022-12-19 ENCOUNTER — Telehealth: Payer: Self-pay | Admitting: Internal Medicine

## 2022-12-30 ENCOUNTER — Encounter: Payer: Self-pay | Admitting: Hematology

## 2022-12-31 ENCOUNTER — Ambulatory Visit (HOSPITAL_COMMUNITY)
Admission: RE | Admit: 2022-12-31 | Discharge: 2022-12-31 | Disposition: A | Discharge status: Home / Self Care | Payer: Medicare Other | Source: Ambulatory Visit | Attending: Internal Medicine | Admitting: Internal Medicine

## 2022-12-31 DIAGNOSIS — R270 Ataxia, unspecified: Secondary | ICD-10-CM | POA: Insufficient documentation

## 2022-12-31 DIAGNOSIS — R296 Repeated falls: Secondary | ICD-10-CM | POA: Insufficient documentation

## 2022-12-31 DIAGNOSIS — C9310 Chronic myelomonocytic leukemia not having achieved remission: Secondary | ICD-10-CM | POA: Diagnosis present

## 2022-12-31 MED ORDER — GADOBUTROL 1 MMOL/ML IV SOLN
8.0000 mL | Freq: Once | INTRAVENOUS | Status: AC | PRN
  Administered 2022-12-31: 8 mL via INTRAVENOUS

## 2023-01-03 ENCOUNTER — Telehealth: Payer: Self-pay | Admitting: *Deleted

## 2023-01-03 ENCOUNTER — Inpatient Hospital Stay: Payer: Medicare Other | Attending: Hematology | Admitting: Internal Medicine

## 2023-01-03 DIAGNOSIS — M4712 Other spondylosis with myelopathy, cervical region: Secondary | ICD-10-CM | POA: Diagnosis not present

## 2023-01-03 DIAGNOSIS — M47812 Spondylosis without myelopathy or radiculopathy, cervical region: Secondary | ICD-10-CM | POA: Insufficient documentation

## 2023-01-03 NOTE — Progress Notes (Unsigned)
I connected with Angelica James on 01/03/23 at  3:00 PM EDT by telephone visit and verified that I am speaking with the correct person using two identifiers.  I discussed the limitations, risks, security and privacy concerns of performing an evaluation and management service by telemedicine and the availability of in-person appointments. I also discussed with the patient that there may be a patient responsible charge related to this service. The patient expressed understanding and agreed to proceed.  Other persons participating in the visit and their role in the encounter:  n/a   Patient's location:  Home Provider's location:  Office Chief Complaint:  Osteoarthritis of cervical spine with myelopathy  History of Present Ilness: Angelica James reports no clinical changes today.  She continues to have issues with her balance, gait.  Pain in her neck remains as prior.  She has started to work with PT which has gone well.  Observations: Language and cognition at baseline  Imaging:  MR CERVICAL SPINE W WO CONTRAST  Result Date: 01/03/2023 CLINICAL DATA:  Ataxia, chronic myelomonocytic leukemia. Frequent falls. EXAM: MRI CERVICAL SPINE WITHOUT AND WITH CONTRAST TECHNIQUE: Multiplanar and multiecho pulse sequences of the cervical spine, to include the craniocervical junction and cervicothoracic junction, were obtained without and with intravenous contrast. CONTRAST:  8mL GADAVIST GADOBUTROL 1 MMOL/ML IV SOLN COMPARISON:  None Available. FINDINGS: Alignment: There is trace anterolisthesis of C7 on T1 and T1 on T2, degenerative in nature. There is no other antero or retrolisthesis. Vertebrae: Vertebral body heights are preserved, without evidence of acute injury. Marrow signal is diffusely abnormally T1 hypointense with enhancement likely reflecting leukemic involvement. There is no epidural or soft tissue tumor. There is no marrow edema. Cord: Normal in signal and morphology without abnormal enhancement.  Posterior Fossa, vertebral arteries, paraspinal tissues: The imaged posterior fossa is unremarkable. The vertebral artery flow voids are normal. The paraspinal soft tissues are unremarkable. Disc levels: There is moderate disc degeneration at C5-C6 and C6-C7 and overall mild disc degeneration elsewhere. C2-C3: Mild uncovertebral and facet arthropathy without significant spinal canal or neural foraminal stenosis. C3-C4: There is central disc protrusion and mild uncovertebral and facet arthropathy resulting in mild spinal canal stenosis and mild left and no significant right neural foraminal stenosis C4-C5: There is a small disc protrusion without significant spinal canal or neural foraminal stenosis. C5-C6: There is a disc protrusion, bilateral uncovertebral ridging, and mild bilateral facet arthropathy resulting in mild spinal canal stenosis and moderate to severe bilateral neural foraminal stenosis C6-C7: There is a broad-based posterior disc osteophyte complex with mild left worse than right uncovertebral ridging, and mild facet arthropathy with ligamentum flavum thickening resulting mild-to-moderate spinal canal stenosis with indentation of the ventral cord surface, and mild-to-moderate left and mild right neural foraminal stenosis. C7-T1: Grade 1 anterolisthesis with right worse than left facet arthropathy but no significant spinal canal or neural foraminal stenosis. IMPRESSION: 1. Diffusely abnormally T1 hypointense marrow signal with enhancement likely reflecting leukemic involvement. No epidural or soft tissue tumor. 2. Degenerative changes at C5-C6 resulting in mild spinal canal stenosis and moderate to severe bilateral neural foraminal stenosis. 3. Mild-to-moderate spinal canal stenosis and mild-to-moderate left and mild right neural foraminal stenosis at C6-C7. Electronically Signed   By: Lesia Hausen M.D.   On: 01/03/2023 12:01     Assessment and Plan: Osteoarthritis of cervical spine with  myelopathy  Angelica James presents with clinical and radiographic syndrome localizing to cervical spine and nerve roots.  Etiology is spondyloarthritis, well demonstrated on  MRI study.  There is no meaningful neoplastic etiology leading to clinical symptoms.  We discussed utility of referral to neurosurgery.  She is agreeable with this.    Will con't PT sessions.    Follow Up Instructions: F/u as needed  I discussed the assessment and treatment plan with the patient.  The patient was provided an opportunity to ask questions and all were answered.  The patient agreed with the plan and demonstrated understanding of the instructions.    The patient was advised to call back or seek an in-person evaluation if the symptoms worsen or if the condition fails to improve as anticipated.    Henreitta Leber, MD   I provided 22 minutes of non face-to-face telephone visit time during this encounter, and > 50% was spent counseling as documented under my assessment & plan.

## 2023-01-03 NOTE — Telephone Encounter (Signed)
PC to both patient & her daughter, Rodney Booze - no answer at either phone number, left messages with both patient & her daughter.  Informed them we need to reschedule patient's appointment, MRI results are not back.  Informed them our scheduling department will contact them to reschedule or they may call [phone].  Scheduling message sent.

## 2023-01-21 ENCOUNTER — Telehealth: Payer: Self-pay | Admitting: *Deleted

## 2023-01-21 NOTE — Telephone Encounter (Signed)
Faxed referral to [facility] [phone]

## 2023-05-27 ENCOUNTER — Other Ambulatory Visit: Payer: Medicare Other

## 2023-05-27 ENCOUNTER — Ambulatory Visit: Payer: Medicare Other | Admitting: Hematology

## 2023-05-31 ENCOUNTER — Encounter: Payer: Self-pay | Admitting: Hematology

## 2023-07-02 ENCOUNTER — Other Ambulatory Visit: Payer: Self-pay

## 2023-07-02 DIAGNOSIS — C9310 Chronic myelomonocytic leukemia not having achieved remission: Secondary | ICD-10-CM

## 2023-07-03 ENCOUNTER — Inpatient Hospital Stay (HOSPITAL_BASED_OUTPATIENT_CLINIC_OR_DEPARTMENT_OTHER): Payer: Medicare Other | Admitting: Hematology

## 2023-07-03 ENCOUNTER — Inpatient Hospital Stay: Payer: Medicare Other | Attending: Hematology

## 2023-07-03 VITALS — BP 143/59 | HR 69 | Temp 97.5°F | Resp 18 | Ht 67.5 in | Wt 181.4 lb

## 2023-07-03 DIAGNOSIS — M25519 Pain in unspecified shoulder: Secondary | ICD-10-CM | POA: Insufficient documentation

## 2023-07-03 DIAGNOSIS — K5900 Constipation, unspecified: Secondary | ICD-10-CM | POA: Insufficient documentation

## 2023-07-03 DIAGNOSIS — Z800 Family history of malignant neoplasm of digestive organs: Secondary | ICD-10-CM | POA: Insufficient documentation

## 2023-07-03 DIAGNOSIS — R6883 Chills (without fever): Secondary | ICD-10-CM | POA: Insufficient documentation

## 2023-07-03 DIAGNOSIS — M47812 Spondylosis without myelopathy or radiculopathy, cervical region: Secondary | ICD-10-CM | POA: Diagnosis not present

## 2023-07-03 DIAGNOSIS — C9310 Chronic myelomonocytic leukemia not having achieved remission: Secondary | ICD-10-CM | POA: Diagnosis present

## 2023-07-03 DIAGNOSIS — Z803 Family history of malignant neoplasm of breast: Secondary | ICD-10-CM | POA: Insufficient documentation

## 2023-07-03 DIAGNOSIS — R110 Nausea: Secondary | ICD-10-CM | POA: Diagnosis not present

## 2023-07-03 DIAGNOSIS — R630 Anorexia: Secondary | ICD-10-CM | POA: Insufficient documentation

## 2023-07-03 DIAGNOSIS — Z90710 Acquired absence of both cervix and uterus: Secondary | ICD-10-CM | POA: Insufficient documentation

## 2023-07-03 DIAGNOSIS — R079 Chest pain, unspecified: Secondary | ICD-10-CM | POA: Insufficient documentation

## 2023-07-03 DIAGNOSIS — D751 Secondary polycythemia: Secondary | ICD-10-CM | POA: Insufficient documentation

## 2023-07-03 DIAGNOSIS — Z809 Family history of malignant neoplasm, unspecified: Secondary | ICD-10-CM | POA: Insufficient documentation

## 2023-07-03 DIAGNOSIS — Z880 Allergy status to penicillin: Secondary | ICD-10-CM | POA: Diagnosis not present

## 2023-07-03 DIAGNOSIS — E860 Dehydration: Secondary | ICD-10-CM | POA: Diagnosis not present

## 2023-07-03 DIAGNOSIS — G20A1 Parkinson's disease without dyskinesia, without mention of fluctuations: Secondary | ICD-10-CM | POA: Insufficient documentation

## 2023-07-03 DIAGNOSIS — F0280 Dementia in other diseases classified elsewhere without behavioral disturbance: Secondary | ICD-10-CM | POA: Diagnosis not present

## 2023-07-03 DIAGNOSIS — Z860102 Personal history of hyperplastic colon polyps: Secondary | ICD-10-CM | POA: Diagnosis not present

## 2023-07-03 DIAGNOSIS — Z87891 Personal history of nicotine dependence: Secondary | ICD-10-CM | POA: Diagnosis not present

## 2023-07-03 DIAGNOSIS — N1831 Chronic kidney disease, stage 3a: Secondary | ICD-10-CM | POA: Insufficient documentation

## 2023-07-03 DIAGNOSIS — Z79899 Other long term (current) drug therapy: Secondary | ICD-10-CM | POA: Diagnosis not present

## 2023-07-03 DIAGNOSIS — Z888 Allergy status to other drugs, medicaments and biological substances status: Secondary | ICD-10-CM | POA: Diagnosis not present

## 2023-07-03 LAB — CMP (CANCER CENTER ONLY)
ALT: 20 U/L (ref 0–44)
AST: 23 U/L (ref 15–41)
Albumin: 4.9 g/dL (ref 3.5–5.0)
Alkaline Phosphatase: 77 U/L (ref 38–126)
Anion gap: 4 — ABNORMAL LOW (ref 5–15)
BUN: 15 mg/dL (ref 8–23)
CO2: 33 mmol/L — ABNORMAL HIGH (ref 22–32)
Calcium: 10.2 mg/dL (ref 8.9–10.3)
Chloride: 104 mmol/L (ref 98–111)
Creatinine: 0.96 mg/dL (ref 0.44–1.00)
GFR, Estimated: >60 mL/min (ref >60)
Glucose, Bld: 94 mg/dL (ref 70–99)
Potassium: 4.4 mmol/L (ref 3.5–5.1)
Sodium: 141 mmol/L (ref 135–145)
Total Bilirubin: 0.8 mg/dL (ref 0.0–1.2)
Total Protein: 8.4 g/dL — ABNORMAL HIGH (ref 6.5–8.1)

## 2023-07-03 LAB — CBC WITH DIFFERENTIAL (CANCER CENTER ONLY)
Abs Immature Granulocytes: 0.41 10*3/uL — ABNORMAL HIGH (ref 0.00–0.07)
Basophils Absolute: 0.1 10*3/uL (ref 0.0–0.1)
Basophils Relative: 0 %
Eosinophils Absolute: 0.6 10*3/uL — ABNORMAL HIGH (ref 0.0–0.5)
Eosinophils Relative: 2 %
HCT: 43.4 % (ref 36.0–46.0)
Hemoglobin: 13.7 g/dL (ref 12.0–15.0)
Immature Granulocytes: 2 %
Lymphocytes Relative: 9 %
Lymphs Abs: 2.1 10*3/uL (ref 0.7–4.0)
MCH: 30 pg (ref 26.0–34.0)
MCHC: 31.6 g/dL (ref 30.0–36.0)
MCV: 95 fL (ref 80.0–100.0)
Monocytes Absolute: 2 10*3/uL — ABNORMAL HIGH (ref 0.1–1.0)
Monocytes Relative: 9 %
Neutro Abs: 17.8 10*3/uL — ABNORMAL HIGH (ref 1.7–7.7)
Neutrophils Relative %: 78 %
Platelet Count: 396 10*3/uL (ref 150–400)
RBC: 4.57 MIL/uL (ref 3.87–5.11)
RDW: 15 % (ref 11.5–15.5)
WBC Count: 22.9 10*3/uL — ABNORMAL HIGH (ref 4.0–10.5)
nRBC: 0 % (ref 0.0–0.2)

## 2023-07-03 LAB — LACTATE DEHYDROGENASE: LDH: 335 U/L — ABNORMAL HIGH (ref 98–192)
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
## 2023-07-19 IMAGING — CT CT BIOPSY
1 of 2 series · 15 of 28 positions shown, 19 images · non-contrast
Comparison: none

INDICATION: Marrow infiltration or MR.

[Series 2: i-spiral 5.0 br40 · axial · 0.98mm/px · z∈[+971,+1118]mm · 15 of 46 slices shown, 19 images]
[im 2/46  mediastinal]
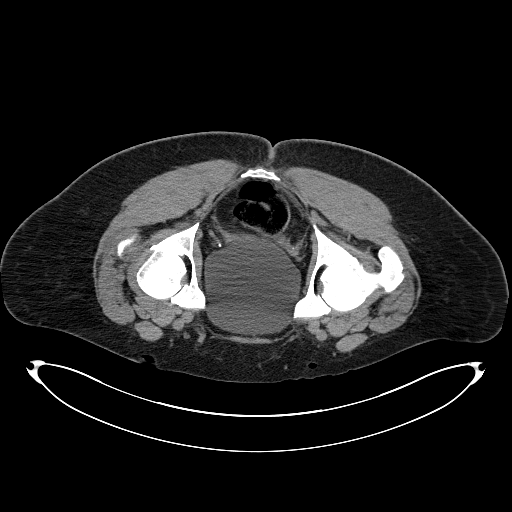
[im 2/46  lung]
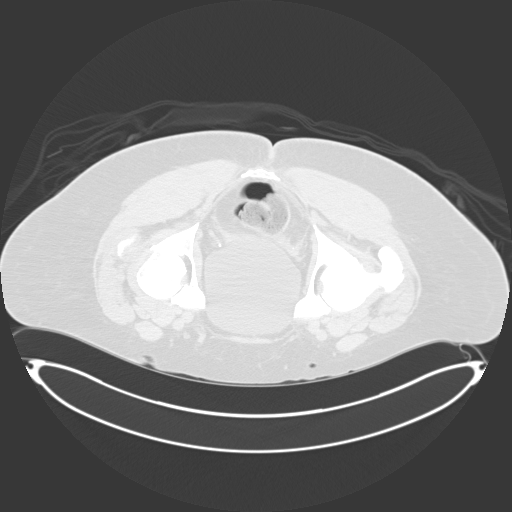
[im 6/46  lung]
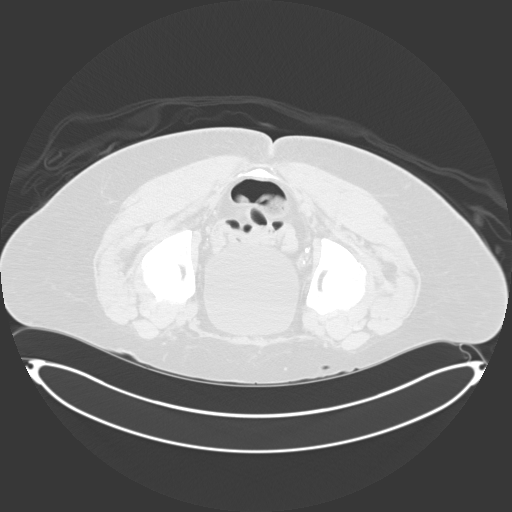
[im 8/46  lung]
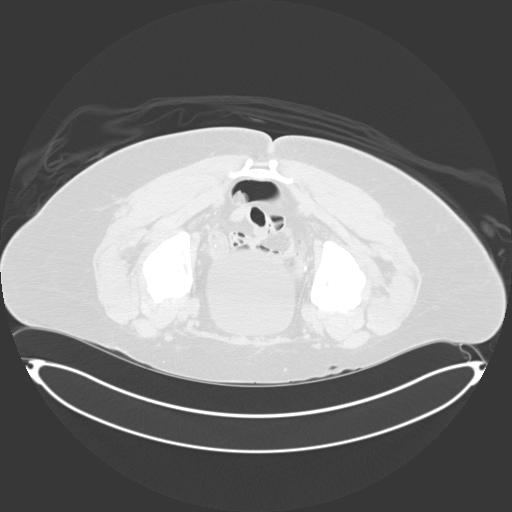
[im 12/46  lung]
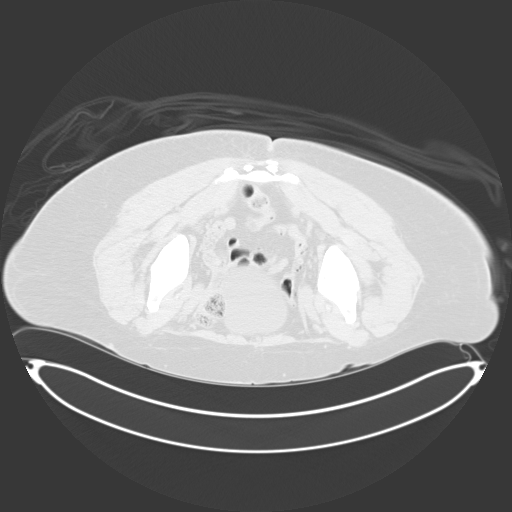
[im 14/46  mediastinal]
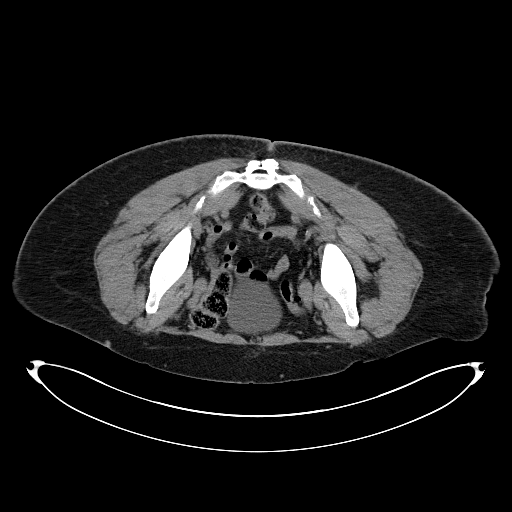
[im 14/46  lung]
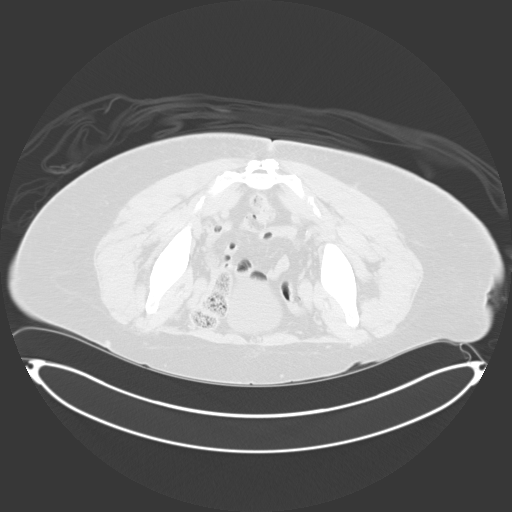
[im 17/46  lung]
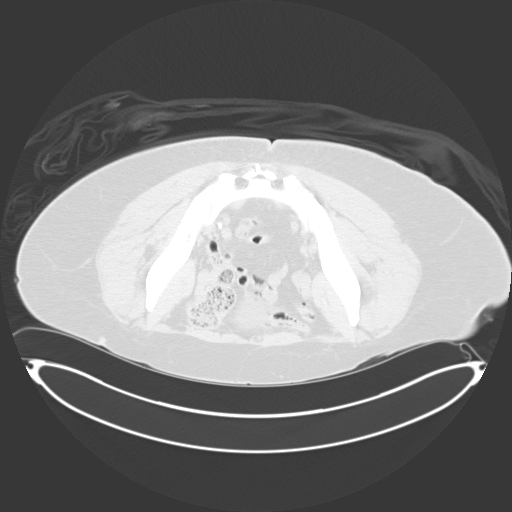
[im 19/46  lung]
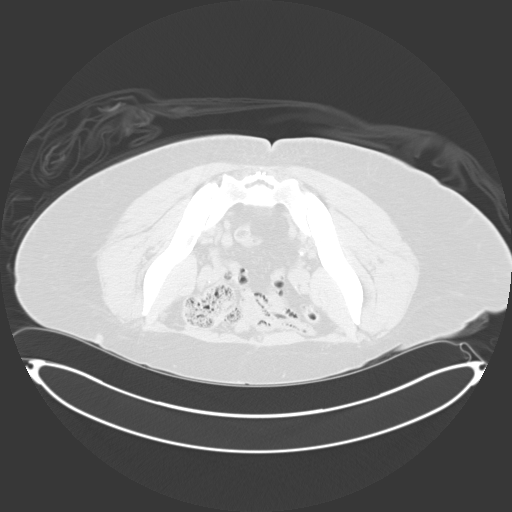
[im 23/46  lung]
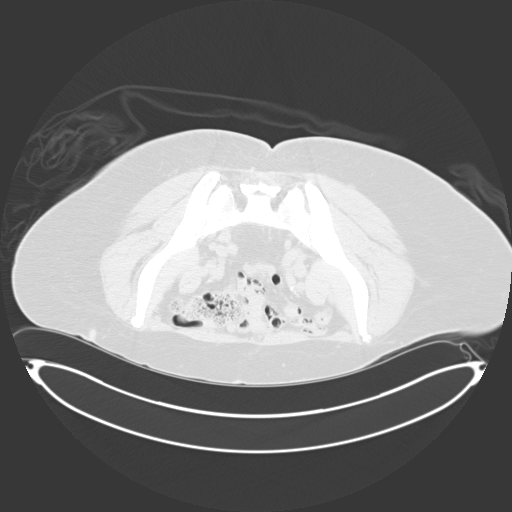
[im 27/46  mediastinal]
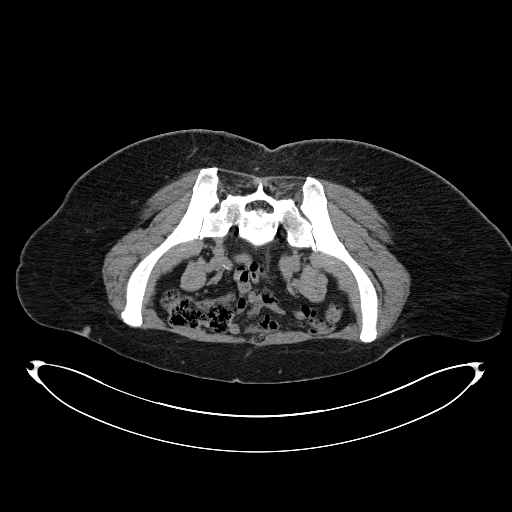
[im 27/46  lung]
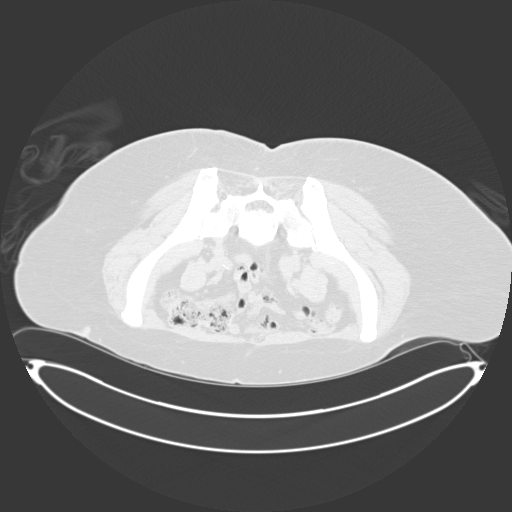
[im 29/46  lung]
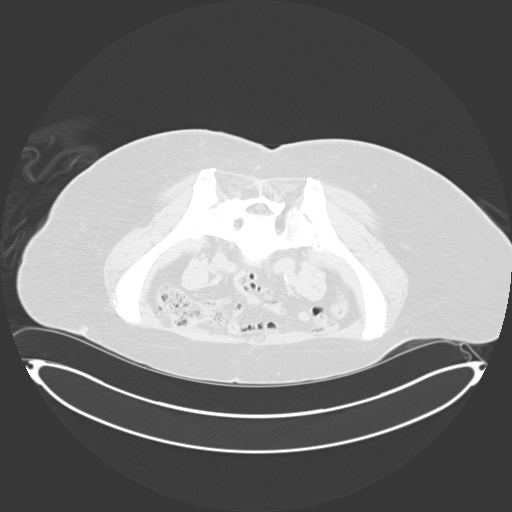
[im 32/46  lung]
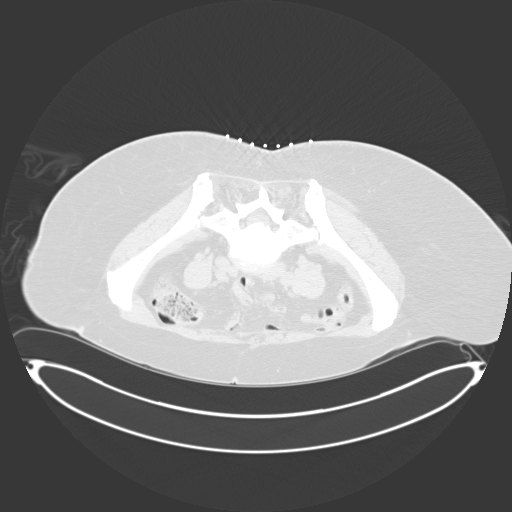
[im 34/46  lung]
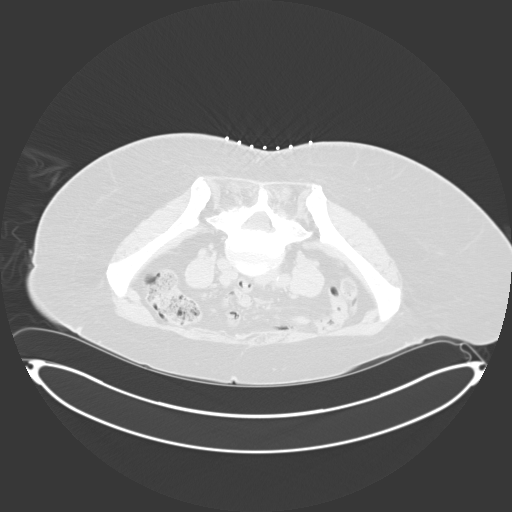
[im 38/46  mediastinal]
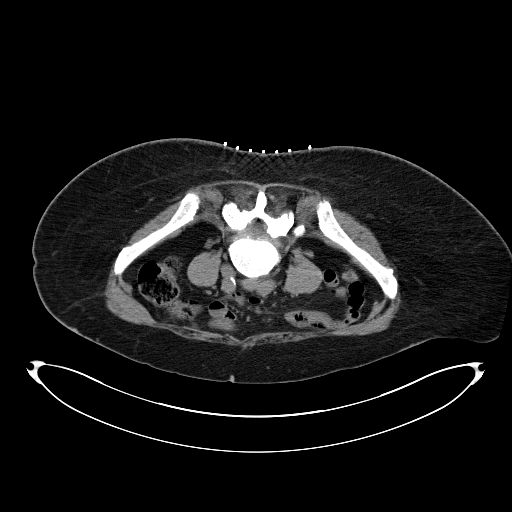
[im 38/46  lung]
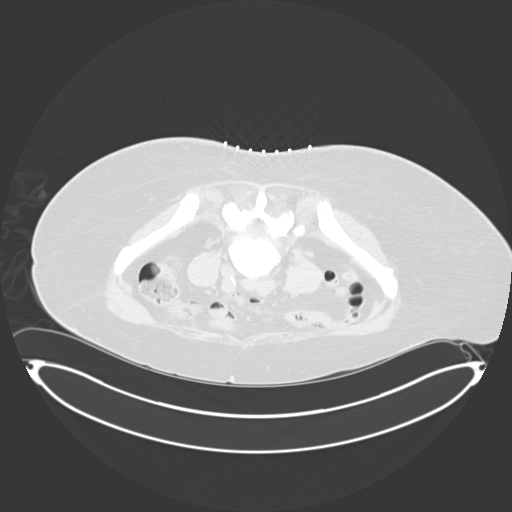
[im 40/46  lung]
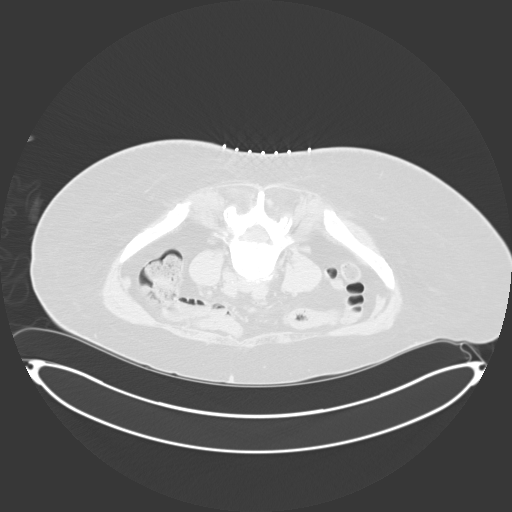
[im 44/46  lung]
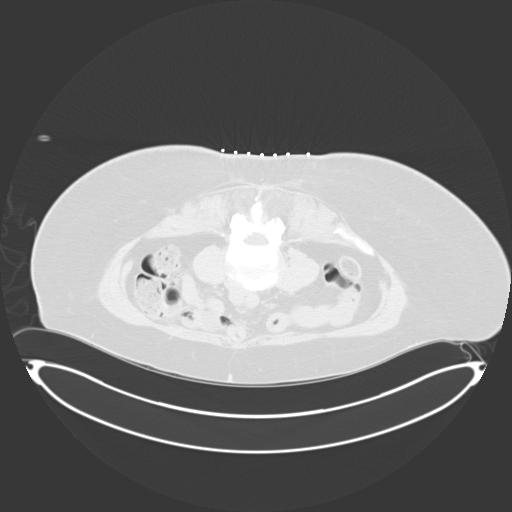

[15 of 28 positions shown; findings below may reference images not displayed]

EXAM:
CT GUIDED BONE MARROW ASPIRATION AND CORE BIOPSY

RADIATION DOSE REDUCTION: This exam was performed according to the
departmental dose-optimization program which includes automated
exposure control, adjustment of the mA and/or kV according to
patient size and/or use of iterative reconstruction technique.

MEDICATIONS:
None.

ANESTHESIA/SEDATION:
Moderate (conscious) sedation was employed during this procedure. A
total of 2 milligrams versed and 100 micrograms fentanyl were
administered intravenously. The patient's level of consciousness and
vital signs were monitored continuously by radiology nursing
throughout the procedure under my direct supervision.

Total monitored sedation time: 14 minutes

FLUOROSCOPY TIME:  CT dose in mGy was not provided.

COMPLICATIONS:
None immediate.

Estimated blood loss: <5 mL

PROCEDURE:
Informed written consent was obtained from the the patient and/or
patient's representative after a thorough discussion of the
procedural risks, benefits and alternatives. All questions were
addressed. Maximal Sterile Barrier Technique was utilized including
caps, mask, sterile gowns, sterile gloves, sterile drape, hand
hygiene and skin antiseptic. A timeout was performed prior to the
initiation of the procedure.

The patient was positioned prone and non-contrast localization CT
was performed of the pelvis to demonstrate the iliac marrow spaces.

Maximal barrier sterile technique utilized including caps, mask,
sterile gowns, sterile gloves, large sterile drape, hand hygiene,
and chlorhexidine prep.

Under sterile conditions and local anesthesia, an 11 gauge coaxial
bone biopsy needle was advanced into the RIGHT iliac marrow space.
Needle position was confirmed with CT imaging. Initially, bone
marrow aspiration was performed. Next, the 11 gauge outer cannula
was utilized to obtain a 1 iliac bone marrow core biopsy. Needle was
removed. Hemostasis was obtained with compression. The patient
tolerated the procedure well. Samples were prepared with the
cytotechnologist.
IMPRESSION: Successful CT-guided bone marrow aspiration and biopsy, as above.

## 2023-12-30 ENCOUNTER — Other Ambulatory Visit: Payer: Self-pay

## 2023-12-30 DIAGNOSIS — C9310 Chronic myelomonocytic leukemia not having achieved remission: Secondary | ICD-10-CM

## 2023-12-31 ENCOUNTER — Inpatient Hospital Stay: Payer: Medicare Other | Attending: Hematology

## 2023-12-31 ENCOUNTER — Inpatient Hospital Stay (HOSPITAL_BASED_OUTPATIENT_CLINIC_OR_DEPARTMENT_OTHER): Payer: Medicare Other | Admitting: Hematology

## 2023-12-31 VITALS — BP 145/72 | HR 64 | Temp 97.5°F | Resp 18 | Wt 172.8 lb

## 2023-12-31 DIAGNOSIS — M542 Cervicalgia: Secondary | ICD-10-CM | POA: Diagnosis not present

## 2023-12-31 DIAGNOSIS — Z800 Family history of malignant neoplasm of digestive organs: Secondary | ICD-10-CM | POA: Diagnosis not present

## 2023-12-31 DIAGNOSIS — C9310 Chronic myelomonocytic leukemia not having achieved remission: Secondary | ICD-10-CM

## 2023-12-31 DIAGNOSIS — Z880 Allergy status to penicillin: Secondary | ICD-10-CM | POA: Diagnosis not present

## 2023-12-31 DIAGNOSIS — R630 Anorexia: Secondary | ICD-10-CM | POA: Diagnosis not present

## 2023-12-31 DIAGNOSIS — Z801 Family history of malignant neoplasm of trachea, bronchus and lung: Secondary | ICD-10-CM | POA: Insufficient documentation

## 2023-12-31 DIAGNOSIS — Z90710 Acquired absence of both cervix and uterus: Secondary | ICD-10-CM | POA: Insufficient documentation

## 2023-12-31 DIAGNOSIS — R109 Unspecified abdominal pain: Secondary | ICD-10-CM | POA: Diagnosis not present

## 2023-12-31 DIAGNOSIS — R5383 Other fatigue: Secondary | ICD-10-CM | POA: Diagnosis not present

## 2023-12-31 DIAGNOSIS — Z79899 Other long term (current) drug therapy: Secondary | ICD-10-CM | POA: Insufficient documentation

## 2023-12-31 DIAGNOSIS — Z87891 Personal history of nicotine dependence: Secondary | ICD-10-CM | POA: Insufficient documentation

## 2023-12-31 DIAGNOSIS — Z860102 Personal history of hyperplastic colon polyps: Secondary | ICD-10-CM | POA: Diagnosis not present

## 2023-12-31 DIAGNOSIS — Z803 Family history of malignant neoplasm of breast: Secondary | ICD-10-CM | POA: Insufficient documentation

## 2023-12-31 DIAGNOSIS — R110 Nausea: Secondary | ICD-10-CM | POA: Diagnosis not present

## 2023-12-31 DIAGNOSIS — D72829 Elevated white blood cell count, unspecified: Secondary | ICD-10-CM | POA: Insufficient documentation

## 2023-12-31 DIAGNOSIS — Z888 Allergy status to other drugs, medicaments and biological substances status: Secondary | ICD-10-CM | POA: Insufficient documentation

## 2023-12-31 DIAGNOSIS — D751 Secondary polycythemia: Secondary | ICD-10-CM | POA: Insufficient documentation

## 2023-12-31 LAB — CMP (CANCER CENTER ONLY)
ALT: 16 U/L (ref 0–44)
AST: 23 U/L (ref 15–41)
Albumin: 4.5 g/dL (ref 3.5–5.0)
Alkaline Phosphatase: 71 U/L (ref 38–126)
Anion gap: 7 (ref 5–15)
BUN: 12 mg/dL (ref 8–23)
CO2: 31 mmol/L (ref 22–32)
Calcium: 9.9 mg/dL (ref 8.9–10.3)
Chloride: 104 mmol/L (ref 98–111)
Creatinine: 0.91 mg/dL (ref 0.44–1.00)
GFR, Estimated: >60 mL/min (ref >60)
Glucose, Bld: 86 mg/dL (ref 70–99)
Potassium: 3.9 mmol/L (ref 3.5–5.1)
Sodium: 142 mmol/L (ref 135–145)
Total Bilirubin: 0.9 mg/dL (ref 0.0–1.2)
Total Protein: 7.8 g/dL (ref 6.5–8.1)

## 2023-12-31 LAB — CBC WITH DIFFERENTIAL (CANCER CENTER ONLY)
Abs Immature Granulocytes: 0.32 K/uL — ABNORMAL HIGH (ref 0.00–0.07)
Basophils Absolute: 0.1 K/uL (ref 0.0–0.1)
Basophils Relative: 1 %
Eosinophils Absolute: 0.3 K/uL (ref 0.0–0.5)
Eosinophils Relative: 2 %
HCT: 46.4 % — ABNORMAL HIGH (ref 36.0–46.0)
Hemoglobin: 15 g/dL (ref 12.0–15.0)
Immature Granulocytes: 2 %
Lymphocytes Relative: 8 %
Lymphs Abs: 1.6 K/uL (ref 0.7–4.0)
MCH: 29.4 pg (ref 26.0–34.0)
MCHC: 32.3 g/dL (ref 30.0–36.0)
MCV: 91 fL (ref 80.0–100.0)
Monocytes Absolute: 1.5 K/uL — ABNORMAL HIGH (ref 0.1–1.0)
Monocytes Relative: 8 %
Neutro Abs: 16.3 K/uL — ABNORMAL HIGH (ref 1.7–7.7)
Neutrophils Relative %: 79 %
Platelet Count: 338 K/uL (ref 150–400)
RBC: 5.1 MIL/uL (ref 3.87–5.11)
RDW: 13.5 % (ref 11.5–15.5)
WBC Count: 20.1 K/uL — ABNORMAL HIGH (ref 4.0–10.5)
nRBC: 0 % (ref 0.0–0.2)

## 2023-12-31 LAB — LACTATE DEHYDROGENASE: LDH: 346 U/L — ABNORMAL HIGH (ref 98–192)
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
## 2024-03-16 ENCOUNTER — Encounter: Payer: Self-pay | Admitting: Hematology

## 2024-07-02 ENCOUNTER — Other Ambulatory Visit: Payer: Self-pay

## 2024-07-02 DIAGNOSIS — C9310 Chronic myelomonocytic leukemia not having achieved remission: Secondary | ICD-10-CM

## 2024-07-03 ENCOUNTER — Inpatient Hospital Stay: Attending: Hematology

## 2024-07-03 ENCOUNTER — Inpatient Hospital Stay: Admitting: Hematology

## 2024-07-03 VITALS — BP 148/72 | HR 78 | Temp 97.5°F | Resp 18 | Wt 169.4 lb

## 2024-07-03 DIAGNOSIS — D45 Polycythemia vera: Secondary | ICD-10-CM | POA: Diagnosis not present

## 2024-07-03 DIAGNOSIS — I82412 Acute embolism and thrombosis of left femoral vein: Secondary | ICD-10-CM

## 2024-07-03 DIAGNOSIS — C9310 Chronic myelomonocytic leukemia not having achieved remission: Secondary | ICD-10-CM | POA: Diagnosis not present

## 2024-07-03 DIAGNOSIS — Z1589 Genetic susceptibility to other disease: Secondary | ICD-10-CM | POA: Diagnosis not present

## 2024-07-03 LAB — CBC WITH DIFFERENTIAL (CANCER CENTER ONLY)
Abs Immature Granulocytes: 0.56 K/uL — ABNORMAL HIGH (ref 0.00–0.07)
Basophils Absolute: 0.2 K/uL — ABNORMAL HIGH (ref 0.0–0.1)
Basophils Relative: 1 %
Eosinophils Absolute: 0.4 K/uL (ref 0.0–0.5)
Eosinophils Relative: 1 %
HCT: 51.7 % — ABNORMAL HIGH (ref 36.0–46.0)
Hemoglobin: 16.6 g/dL — ABNORMAL HIGH (ref 12.0–15.0)
Immature Granulocytes: 2 %
Lymphocytes Relative: 7 %
Lymphs Abs: 1.9 K/uL (ref 0.7–4.0)
MCH: 27.9 pg (ref 26.0–34.0)
MCHC: 32.1 g/dL (ref 30.0–36.0)
MCV: 86.9 fL (ref 80.0–100.0)
Monocytes Absolute: 1.7 K/uL — ABNORMAL HIGH (ref 0.1–1.0)
Monocytes Relative: 6 %
Neutro Abs: 22.5 K/uL — ABNORMAL HIGH (ref 1.7–7.7)
Neutrophils Relative %: 83 %
Platelet Count: 375 K/uL (ref 150–400)
RBC: 5.95 MIL/uL — ABNORMAL HIGH (ref 3.87–5.11)
RDW: 18.1 % — ABNORMAL HIGH (ref 11.5–15.5)
WBC Count: 27.1 K/uL — ABNORMAL HIGH (ref 4.0–10.5)
nRBC: 0 % (ref 0.0–0.2)

## 2024-07-03 LAB — CMP (CANCER CENTER ONLY)
ALT: 30 U/L (ref 0–44)
AST: 35 U/L (ref 15–41)
Albumin: 4.8 g/dL (ref 3.5–5.0)
Alkaline Phosphatase: 113 U/L (ref 38–126)
Anion gap: 10 (ref 5–15)
BUN: 9 mg/dL (ref 8–23)
CO2: 29 mmol/L (ref 22–32)
Calcium: 9.9 mg/dL (ref 8.9–10.3)
Chloride: 104 mmol/L (ref 98–111)
Creatinine: 0.94 mg/dL (ref 0.44–1.00)
GFR, Estimated: >60 mL/min
Glucose, Bld: 86 mg/dL (ref 70–99)
Potassium: 4.5 mmol/L (ref 3.5–5.1)
Sodium: 142 mmol/L (ref 135–145)
Total Bilirubin: 1 mg/dL (ref 0.0–1.2)
Total Protein: 8.4 g/dL — ABNORMAL HIGH (ref 6.5–8.1)

## 2024-07-03 LAB — LACTATE DEHYDROGENASE: LDH: 366 U/L — ABNORMAL HIGH (ref 105–235)

## 2024-07-03 MED ORDER — RIVAROXABAN 20 MG PO TABS: 20.0000 mg | ORAL_TABLET | Freq: Every day | ORAL | 5 refills | Status: AC

## 2024-07-03 MED ORDER — HYDROXYUREA 500 MG PO CAPS: 500.0000 mg | ORAL_CAPSULE | ORAL | 1 refills | Status: DC

## 2024-07-03 NOTE — Progress Notes (Signed)
 " HEMATOLOGY ONCOLOGY PROGRESS NOTE  Date of service: 07/03/2024  Patient Care Team: Pcp, No as PCP - General  CHIEF COMPLAINT/PURPOSE OF CONSULTATION: Follow-up for continued evaluation and management of CMML1.  HISTORY OF PRESENTING ILLNESS: See previous note for details on initial presentation.   SUMMARY OF ONCOLOGIC HISTORY: Oncology History   No problem history exists.    INTERVAL HISTORY: Angelica James is a 79 y.o. female who is here today for continued evaluation and management of CMML1. She is accompanied by her daughter today.  she was last seen by me on 12/31/2023; at the time she mentioned experiencing abdominal pain on palpation. She was dx with gasteroenteritis during her ED visit on 09/03/2023 for abdominal pain and distension.   Today, she reports recent blood clots. She was recently admitted to the hospital for a fermoral clot. She reports pain behind her left leg in the location of the clot and the Baker's cyst. She has some lingering leg edema. She is not using compression socks.   She has a lump on her neck which has been evaluated by her PCP. She has been referred to an ENT. She has difficulty swallowing and talking. She reports she does not have issues drinking water, and she typically drinks about 1.5 to 2 40z bottles of water per day. She notes chest pain and throat pain when she swallows and when she reaches above her head.   She reports intermittent blurred vision.   She denies stroke like symtpoms.   REVIEW OF SYSTEMS:   10 Point review of systems of done and is negative except as noted above.  MEDICAL HISTORY Past Medical History:  Diagnosis Date   Chronic kidney disease     SURGICAL HISTORY Past Surgical History:  Procedure Laterality Date   ABDOMINAL HYSTERECTOMY     BACK SURGERY     HERNIA REPAIR     KNEE SURGERY      SOCIAL HISTORY Social History[1]  Social History   Social History Narrative   Not on file    SOCIAL DRIVERS OF  HEALTH SDOH Screenings   Food Insecurity: At Risk (06/25/2024)   Received from [facility]  Housing: Low Risk (11/27/2022)  Transportation Needs: At Risk (06/25/2024)   Received from [facility]  Utilities: Not At Risk (11/27/2022)  Depression (PHQ2-9): Low Risk (11/27/2022)  Financial Resource Strain: At Risk (06/25/2024)   Received from [facility]  Physical Activity: At Risk (06/25/2024)   Received from [facility]  Social Connections: Not at Risk (06/25/2024)   Received from [facility]  Stress: Not at Risk (06/25/2024)   Received from [facility]  Tobacco Use: Medium Risk (06/16/2024)   Received from [facility]     FAMILY HISTORY No family history on file.   ALLERGIES: is allergic to penicillins and meloxicam.  MEDICATIONS  Current Outpatient Medications  Medication Sig Dispense Refill   acetaminophen (TYLENOL) 650 MG CR tablet Take 1 tablet by mouth daily.     albuterol (VENTOLIN HFA) 108 (90 Base) MCG/ACT inhaler Inhale into the lungs.     aspirin 81 MG EC tablet Take by mouth.     cetirizine (ZYRTEC) 10 MG tablet Take by mouth.     Cholecalciferol 25 MCG (1000 UT) tablet Take by mouth.     cyclobenzaprine (FLEXERIL) 10 MG tablet Take by mouth.     Docusate Sodium (DSS) 100 MG CAPS Take by mouth.     fluticasone (FLONASE) 50 MCG/ACT nasal spray Place into the nose.  gabapentin (NEURONTIN) 100 MG capsule Take by mouth.     linaclotide (LINZESS) 290 MCG CAPS capsule Take by mouth.     omeprazole (PRILOSEC) 40 MG capsule Take by mouth.     No current facility-administered medications for this visit.    PHYSICAL EXAMINATION: ECOG PERFORMANCE STATUS: 1 - Symptomatic but completely ambulatory VITALS: Vitals:   07/03/24 1413  BP: (!) 148/72  Pulse: 78  Resp: 18  Temp: (!) 97.5 F (36.4 C)  SpO2: 97%   Filed Weights   07/03/24 1413  Weight: 169 lb 6.4 oz (76.8 kg)   Body mass index is 26.14 kg/m.  GENERAL: alert, in no acute distress and comfortable SKIN: no acute rashes, no  significant lesions EYES: conjunctiva are pink and non-injected, sclera anicteric OROPHARYNX: MMM, no exudates, no oropharyngeal erythema or ulceration NECK: supple, no JVD LYMPH:  no palpable lymphadenopathy in the cervical, axillary or inguinal regions LUNGS: clear to auscultation b/l with normal respiratory effort HEART: regular rate & rhythm ABDOMEN:  normoactive bowel sounds , non tender, not distended, no hepatosplenomegaly (+) some tenderness on palpation of her abdomen Extremity: no pedal edema PSYCH: alert & oriented x 3 with fluent speech NEURO: no focal motor/sensory deficits  LABORATORY DATA:   I have reviewed the data as listed     Latest Ref Rng & Units 07/03/2024    1:36 PM 12/31/2023    1:38 PM 07/03/2023   11:58 AM  CBC EXTENDED  WBC 4.0 - 10.5 K/uL 27.1  20.1  22.9   RBC 3.87 - 5.11 MIL/uL 5.95  5.10  4.57   Hemoglobin 12.0 - 15.0 g/dL 83.3  84.9  86.2   HCT 36.0 - 46.0 % 51.7  46.4  43.4   Platelets 150 - 400 K/uL 375  338  396   NEUT# 1.7 - 7.7 K/uL 22.5  16.3  17.8   Lymph# 0.7 - 4.0 K/uL 1.9  1.6  2.1        Latest Ref Rng & Units 07/03/2024    1:36 PM 12/31/2023    1:38 PM 07/03/2023   11:58 AM  CMP  Glucose 70 - 99 mg/dL 86  86  94   BUN 8 - 23 mg/dL 9  12  15    Creatinine 0.44 - 1.00 mg/dL 9.05  9.08  9.03   Sodium 135 - 145 mmol/L 142  142  141   Potassium 3.5 - 5.1 mmol/L 4.5  3.9  4.4   Chloride 98 - 111 mmol/L 104  104  104   CO2 22 - 32 mmol/L 29  31  33   Calcium 8.9 - 10.3 mg/dL 9.9  9.9  89.7   Total Protein 6.5 - 8.1 g/dL 8.4  7.8  8.4   Total Bilirubin 0.0 - 1.2 mg/dL 1.0  0.9  0.8   Alkaline Phos 38 - 126 U/L 113  71  77   AST 15 - 41 U/L 35  23  23   ALT 0 - 44 U/L 30  16  20      US  DUPLEX LOWER EXTREMITY VEINS LEFT  Anatomical Region Laterality Modality  Extremity -- Ultrasound   Impression  IMPRESSION:  DVT of the distal left femoral vein and popliteal vein.   Small left Baker's cyst   Reading Doctor: Vicci Dawn  Electronic Signature by: Vicci Dawn Narrative  This result has an attachment that is not available. INDICATION: Pain and swelling  TECHNIQUE:   Duplex US  examination of  the veins of the left lower extremity(s) was performed and included Grey scale imaging, color flow Doppler imaging, spectral Doppler waveform analysis, and response to vein compression and augmentation maneuvers.  COMPARISON: None available.  FINDINGS:  There is near occlusive thrombus seen in the distal left femoral vein and the left popliteal vein.  There is a left Baker's cyst measuring 4 cm in length and 2.3 cm in transverse dimension. Procedure Note  Vicci Dawn, MD - 06/16/2024 Formatting of this note might be different from the original. INDICATION: Pain and swelling  TECHNIQUE:   Duplex US  examination of the veins of the left lower extremity(s) was performed and included Grey scale imaging, color flow Doppler imaging, spectral Doppler waveform analysis, and response to vein compression and augmentation maneuvers.  COMPARISON: None available.  FINDINGS:  There is near occlusive thrombus seen in the distal left femoral vein and the left popliteal vein.  There is a left Baker's cyst measuring 4 cm in length and 2.3 cm in transverse dimension.  IMPRESSION IMPRESSION:  DVT of the distal left femoral vein and popliteal vein.   Small left Baker's cyst  Reading Doctor: Vicci Dawn  Electronic Signature by: Vicci Dawn Exam End: 06/16/24 14:11   Specimen Collected: 06/16/24 15:31 Last Resulted: 06/16/24 15:34  Received From: [facility]      Component     Latest Ref Rng & Units 08/10/2021  IgG (Immunoglobin G), Serum     586 - 1,602 mg/dL 8,437  IgA     64 - 577 mg/dL 748  IgM (Immunoglobulin M), Srm     26 - 217 mg/dL 792  Total Protein ELP     6.0 - 8.5 g/dL 8.0  Albumin SerPl Elph-Mcnc     2.9 - 4.4 g/dL 4.4  Alpha 1     0.0 - 0.4 g/dL 0.2  Alpha2  Glob SerPl Elph-Mcnc     0.4 - 1.0 g/dL 0.7  B-Globulin SerPl Elph-Mcnc     0.7 - 1.3 g/dL 1.1  Gamma Glob SerPl Elph-Mcnc     0.4 - 1.8 g/dL 1.6  M Protein SerPl Elph-Mcnc     Not Observed g/dL Not Observed  Globulin, Total     2.2 - 3.9 g/dL 3.6  Albumin/Glob SerPl     0.7 - 1.7 1.3  IFE 1      Comment  Please Note (HCV):      Comment  Kappa free light chain     3.3 - 19.4 mg/L 24.9 (H)  Lambda free light chains     5.7 - 26.3 mg/L 14.7  Kappa, lambda light chain ratio     0.26 - 1.65 1.69 (H)  Beta-2  Microglobulin     0.6 - 2.4 mg/L 2.1  LDH     98 - 192 U/L 318 (H)     Molecular Pathology 09/29/2021    Surgical Pathology  CASE: [id number]  PATIENT: ORLEAN SIERRA  Flow Pathology Report    Clinical history: Abnormal bone marrow examination   DIAGNOSIS:   -No monoclonal B-cell population or significant T-cell abnormalities  identified    GATING AND PHENOTYPIC ANALYSIS:   Gated population: Flow cytometric immunophenotyping is performed using  antibodies to the antigens listed in the table below. Electronic gates  are placed around a cell cluster displaying light scatter properties  corresponding to: lymphocytes   Abnormal Cells in gated population: N/A   Phenotype of Abnormal Cells: N/A    Surgical Pathology  CASE: [id number]  PATIENT: ORLEAN SIERRA  Bone Marrow Report    Clinical History: Abnormal bone marrow examination (BH)    DIAGNOSIS:   BONE MARROW, ASPIRATE, CLOT, CORE:  -  Hypercellular marrow with myeloid and megakaryocytic hyperplasia and  dyspoiesis  -  See comment and microscopic description below   PERIPHERAL BLOOD:  -  Polycythemia  -  Leukocytosis with absolute monocytosis  -  See complete blood cell count   COMMENT:   The findings in the marrow are consistent with a myeloid neoplasm.  The  combined findings of myeloid hyperplasia with monocytosis and dyspoiesis  is suggestive of a  myeloproliferative/myelodysplastic neoplasm,  specifically chronic myelomonocytic leukemia.  Correlation with NGS and  FISH is recommended for further subclassification.   MICROSCOPIC DESCRIPTION:   PERIPHERAL BLOOD SMEAR: The peripheral blood has a polycythemia and  leukocytosis.  While leukocytes are predominantly neutrophils there is  an absolute monocytosis.  Circulating blasts are not identified.  There  is not a significant basophil population present.     RADIOGRAPHIC STUDIES: I have personally reviewed the radiological images as listed and agreed with the findings in the report. No results found.  ASSESSMENT & PLAN:  79 y.o. female with  #1   CMML 1  Patient was noted to have abnormal bone marrow infiltrative change in the spine and pelvis on MRI Referred to rule out myeloproliferative neoplasm.   #2 leukocytosis with labs done 08/10/2021 show WBC count of 18.6k with neutrophil count of 13.2k monocytes of 2.3 k and platelets of 420k Flow cytometry with no clonal lymphocytes Myeloma panel with no M spike Bone marrow biopsy consistent with CMML 1  #3 Stage III CKD  PLAN: - Discussed lab results on 07/03/2024 in detail with patient: -RBC has increased 5.97 -WBC increased from 20.7 in July to 27.1  -Monocytes have remained stable, neutrophils have increased from 16k to 22k  -RBC percentage at 51.7% -We discussed the typical treatment for excessive RBC production is recurrent phlebotomies (monthly) with the goal to keep Hematocrit % closer to 45%  -we will schedule a phlebotomy for the next week -May consider long-term blood thinners to prevent clots -We will begin Hydroxyurea  to mitigate bone marrow RBC production -With starting hydroxyurea , we will begin with the lowest dose, and will continue to monitor her CBC and CMP throughout treatment. We will start with three pills per week taken po. -discussed side effects of Hydroxyurea , being similar to chemothearpy  -Discussed  connecting Ms. Griffey with a Hematologist who is more local to her, as she lives 3.5 hours away  -Will refill Xarelto  and send to [facility] on [address] -Recommended using compression socks to reduce risk of recurrent clots in her legs -Will order genetic testing to check for sickle cell marker  -We will hold off on doing a major work-up and assessment for blood clots Labs and therapeutic phlebotomy in 7-10 days RTC with Dr Onesimo with labs and appointment for therapeuti phlebotomy in 6 weeks  FOLLOW-UP in 6 weeks for labs and follow-up with Dr. Onesimo.  The total time spent in the appointment was *** minutes* .  All of the patient's questions were answered and the patient knows to call the clinic with any problems, questions, or concerns.  Angelica Onesimo MD MS AAHIVMS [facility] Hematology/Oncology Physician [facility]  *Total Encounter Time as defined by the Centers for Medicare and Medicaid Services includes, in addition to the face-to-face time of a patient visit (documented in the note above) non-face-to-face  time: obtaining and reviewing outside history, ordering and reviewing medications, tests or procedures, care coordination (communications with other health care professionals or caregivers) and documentation in the medical record.  I, Alan Blowers, acting as a neurosurgeon for Angelica Saran, MD.,have documented all relevant documentation on the behalf of Angelica Saran, MD,as directed by  Angelica Saran, MD while in the presence of Angelica Saran, MD.  I have reviewed the above documentation for accuracy and completeness, and I agree with the above.  Angelica Saran, MD     [1]  Social History Tobacco Use   Smoking status: Former    Types: Cigarettes   Smokeless tobacco: Never   "

## 2024-07-08 ENCOUNTER — Other Ambulatory Visit: Payer: Self-pay | Admitting: Hematology

## 2024-07-09 ENCOUNTER — Encounter: Payer: Self-pay | Admitting: Hematology

## 2024-07-14 ENCOUNTER — Other Ambulatory Visit: Payer: Self-pay

## 2024-07-14 DIAGNOSIS — C9310 Chronic myelomonocytic leukemia not having achieved remission: Secondary | ICD-10-CM

## 2024-07-14 DIAGNOSIS — D45 Polycythemia vera: Secondary | ICD-10-CM

## 2024-07-15 ENCOUNTER — Telehealth: Payer: Self-pay | Admitting: Hematology

## 2024-07-15 ENCOUNTER — Inpatient Hospital Stay

## 2024-07-15 NOTE — Telephone Encounter (Signed)
 Received a callback from the patients daughter to move the appts made for 2/27. I re-did the scheduled appts since they were incorrect and she needs labs and phlebotomy and then in 6 weeks, labs, md, and phlebotomy. We got everything squared away and the daughter is aware.

## 2024-07-20 ENCOUNTER — Inpatient Hospital Stay

## 2024-07-28 ENCOUNTER — Inpatient Hospital Stay

## 2024-08-14 ENCOUNTER — Inpatient Hospital Stay: Admitting: Hematology

## 2024-08-14 ENCOUNTER — Inpatient Hospital Stay

## 2024-08-26 ENCOUNTER — Inpatient Hospital Stay: Admitting: Hematology

## 2024-08-26 ENCOUNTER — Inpatient Hospital Stay

## 2024-09-07 ENCOUNTER — Inpatient Hospital Stay: Admitting: Hematology

## 2024-09-07 ENCOUNTER — Inpatient Hospital Stay
